# Patient Record
Sex: Female | Born: 1985 | Race: White | Hispanic: No | Marital: Married | Smoking: Never smoker
Health system: Southern US, Community
[De-identification: ages and names within clinical notes are randomized; demographics above are authoritative.]

## PROBLEM LIST (undated history)

## (undated) DIAGNOSIS — Z789 Other specified health status: Secondary | ICD-10-CM

## (undated) DIAGNOSIS — S62609A Fracture of unspecified phalanx of unspecified finger, initial encounter for closed fracture: Secondary | ICD-10-CM

## (undated) HISTORY — DX: Other specified health status: Z78.9

## (undated) HISTORY — PX: NO PAST SURGERIES: SHX2092

## (undated) HISTORY — PX: EYE SURGERY: SHX253

## (undated) HISTORY — PX: WISDOM TOOTH EXTRACTION: SHX21

---

## 2007-11-14 ENCOUNTER — Emergency Department (HOSPITAL_COMMUNITY): Admission: EM | Admit: 2007-11-14 | Discharge: 2007-11-15 | Payer: Self-pay | Admitting: Emergency Medicine

## 2011-04-06 LAB — ABO/RH: RH Type: POSITIVE

## 2011-04-06 LAB — ANTIBODY SCREEN: Antibody Screen: NEGATIVE

## 2011-04-06 LAB — CBC: Platelets: 298 10*3/uL (ref 150–399)

## 2011-04-06 LAB — STREP B DNA PROBE: GBS: NEGATIVE

## 2011-04-06 LAB — RUBELLA ANTIBODY, IGM: Rubella: IMMUNE

## 2011-07-10 NOTE — L&D Delivery Note (Signed)
Delivery Note At 5:44 PM a viable female was delivered via Vaginal, Spontaneous Delivery (Presentation: Right Occiput Posterior).  APGAR: 9, 9; weight 7 lb 4.8 oz (3311 g).   Placenta status: Intact, Spontaneous.  Cord: 3 vessels with the following complications: None.  Anesthesia: Epidural  Episiotomy: None Lacerations: 2nd degree;Perineal Suture Repair: 3.0 vicryl rapide and 2-0 vicryl to reinforce the sphincter Est. Blood Loss (mL): 400cc  Mom to postpartum.  Baby to nursery-stable.  Oliver Pila 10/15/2011, 6:37 PM

## 2011-09-17 LAB — STREP B DNA PROBE: GBS: NEGATIVE

## 2011-10-10 ENCOUNTER — Telehealth (HOSPITAL_COMMUNITY): Payer: Self-pay | Admitting: *Deleted

## 2011-10-10 ENCOUNTER — Encounter (HOSPITAL_COMMUNITY): Payer: Self-pay | Admitting: *Deleted

## 2011-10-10 NOTE — Telephone Encounter (Signed)
Preadmission screen  

## 2011-10-14 ENCOUNTER — Inpatient Hospital Stay (HOSPITAL_COMMUNITY)
Admission: AD | Admit: 2011-10-14 | Discharge: 2011-10-15 | Disposition: A | Discharge status: Home / Self Care | Payer: BC Managed Care – PPO | Source: Ambulatory Visit | Attending: Obstetrics and Gynecology | Admitting: Obstetrics and Gynecology

## 2011-10-14 ENCOUNTER — Encounter (HOSPITAL_COMMUNITY): Payer: Self-pay | Admitting: *Deleted

## 2011-10-14 DIAGNOSIS — O479 False labor, unspecified: Secondary | ICD-10-CM | POA: Insufficient documentation

## 2011-10-14 NOTE — MAU Note (Signed)
Pt states started having uc's regularly about 2 hours ago.Ruth Mccarthy Has had irregular uc's throughout the day and states she lost her mucous plug today also.

## 2011-10-15 ENCOUNTER — Encounter (HOSPITAL_COMMUNITY): Payer: Self-pay | Admitting: *Deleted

## 2011-10-15 ENCOUNTER — Inpatient Hospital Stay (HOSPITAL_COMMUNITY)
Admission: AD | Admit: 2011-10-15 | Discharge: 2011-10-17 | DRG: 373 | Disposition: A | Discharge status: Home / Self Care | Payer: BC Managed Care – PPO | Attending: Obstetrics and Gynecology | Admitting: Obstetrics and Gynecology

## 2011-10-15 ENCOUNTER — Inpatient Hospital Stay (HOSPITAL_COMMUNITY): Payer: BC Managed Care – PPO | Admitting: Anesthesiology

## 2011-10-15 ENCOUNTER — Encounter (HOSPITAL_COMMUNITY): Payer: Self-pay | Admitting: Anesthesiology

## 2011-10-15 LAB — CBC
MCH: 29.7 pg (ref 26.0–34.0)
MCV: 87.6 fL (ref 78.0–100.0)
Platelets: 194 10*3/uL (ref 150–400)
RDW: 13.2 % (ref 11.5–15.5)
WBC: 11 10*3/uL — ABNORMAL HIGH (ref 4.0–10.5)

## 2011-10-15 LAB — ABO/RH: ABO/RH(D): O POS

## 2011-10-15 LAB — GC/CHLAMYDIA PROBE AMP, GENITAL: Chlamydia: NEGATIVE

## 2011-10-15 MED ORDER — ZOLPIDEM TARTRATE 10 MG PO TABS
10.0000 mg | ORAL_TABLET | Freq: Once | ORAL | Status: AC
  Administered 2011-10-15: 10 mg via ORAL
  Filled 2011-10-15: qty 1

## 2011-10-15 MED ORDER — OXYCODONE-ACETAMINOPHEN 5-325 MG PO TABS
1.0000 | ORAL_TABLET | ORAL | Status: DC | PRN
  Administered 2011-10-15 – 2011-10-16 (×4): 1 via ORAL
  Filled 2011-10-15 (×4): qty 1

## 2011-10-15 MED ORDER — PHENYLEPHRINE 40 MCG/ML (10ML) SYRINGE FOR IV PUSH (FOR BLOOD PRESSURE SUPPORT): 80.0000 ug | PREFILLED_SYRINGE | INTRAVENOUS | Status: DC | PRN

## 2011-10-15 MED ORDER — CITRIC ACID-SODIUM CITRATE 334-500 MG/5ML PO SOLN: 30.0000 mL | ORAL | Status: DC | PRN

## 2011-10-15 MED ORDER — DIPHENHYDRAMINE HCL 50 MG/ML IJ SOLN: 12.5000 mg | INTRAMUSCULAR | Status: DC | PRN

## 2011-10-15 MED ORDER — OXYCODONE-ACETAMINOPHEN 5-325 MG PO TABS: 1.0000 | ORAL_TABLET | ORAL | Status: DC | PRN

## 2011-10-15 MED ORDER — OXYTOCIN BOLUS FROM INFUSION
500.0000 mL | Freq: Once | INTRAVENOUS | Status: DC
  Filled 2011-10-15: qty 500

## 2011-10-15 MED ORDER — IBUPROFEN 600 MG PO TABS
600.0000 mg | ORAL_TABLET | Freq: Four times a day (QID) | ORAL | Status: DC
  Administered 2011-10-15 – 2011-10-17 (×6): 600 mg via ORAL
  Filled 2011-10-15 (×7): qty 1

## 2011-10-15 MED ORDER — SENNOSIDES-DOCUSATE SODIUM 8.6-50 MG PO TABS
2.0000 | ORAL_TABLET | Freq: Every day | ORAL | Status: DC
  Administered 2011-10-15 – 2011-10-16 (×2): 2 via ORAL

## 2011-10-15 MED ORDER — LACTATED RINGERS IV SOLN: 500.0000 mL | INTRAVENOUS | Status: DC | PRN

## 2011-10-15 MED ORDER — OXYTOCIN 20 UNITS IN LACTATED RINGERS INFUSION - SIMPLE
1.0000 m[IU]/min | INTRAVENOUS | Status: DC
  Administered 2011-10-15: 2 m[IU]/min via INTRAVENOUS
  Filled 2011-10-15: qty 1000

## 2011-10-15 MED ORDER — TERBUTALINE SULFATE 1 MG/ML IJ SOLN: 0.2500 mg | Freq: Once | INTRAMUSCULAR | Status: DC | PRN

## 2011-10-15 MED ORDER — LIDOCAINE HCL (PF) 1 % IJ SOLN
INTRAMUSCULAR | Status: DC | PRN
  Administered 2011-10-15 (×3): 4 mL

## 2011-10-15 MED ORDER — ACETAMINOPHEN 325 MG PO TABS: 650.0000 mg | ORAL_TABLET | ORAL | Status: DC | PRN

## 2011-10-15 MED ORDER — FENTANYL 2.5 MCG/ML BUPIVACAINE 1/10 % EPIDURAL INFUSION (WH - ANES)
14.0000 mL/h | INTRAMUSCULAR | Status: DC
  Administered 2011-10-15 (×3): 14 mL/h via EPIDURAL
  Filled 2011-10-15 (×2): qty 60

## 2011-10-15 MED ORDER — ONDANSETRON HCL 4 MG PO TABS: 4.0000 mg | ORAL_TABLET | ORAL | Status: DC | PRN

## 2011-10-15 MED ORDER — LANOLIN HYDROUS EX OINT: TOPICAL_OINTMENT | CUTANEOUS | Status: DC | PRN

## 2011-10-15 MED ORDER — DIBUCAINE 1 % RE OINT: 1.0000 "application " | TOPICAL_OINTMENT | RECTAL | Status: DC | PRN

## 2011-10-15 MED ORDER — LACTATED RINGERS IV SOLN: 500.0000 mL | Freq: Once | INTRAVENOUS | Status: DC

## 2011-10-15 MED ORDER — TETANUS-DIPHTH-ACELL PERTUSSIS 5-2.5-18.5 LF-MCG/0.5 IM SUSP: 0.5000 mL | Freq: Once | INTRAMUSCULAR | Status: DC

## 2011-10-15 MED ORDER — LACTATED RINGERS IV SOLN
INTRAVENOUS | Status: DC
  Administered 2011-10-15: 13:00:00 via INTRAVENOUS

## 2011-10-15 MED ORDER — ONDANSETRON HCL 4 MG/2ML IJ SOLN: 4.0000 mg | Freq: Four times a day (QID) | INTRAMUSCULAR | Status: DC | PRN

## 2011-10-15 MED ORDER — PRENATAL MULTIVITAMIN CH
1.0000 | ORAL_TABLET | Freq: Every day | ORAL | Status: DC
  Administered 2011-10-16 – 2011-10-17 (×2): 1 via ORAL
  Filled 2011-10-15 (×2): qty 1

## 2011-10-15 MED ORDER — EPHEDRINE 5 MG/ML INJ
10.0000 mg | INTRAVENOUS | Status: DC | PRN
  Filled 2011-10-15: qty 4

## 2011-10-15 MED ORDER — ZOLPIDEM TARTRATE 5 MG PO TABS: 5.0000 mg | ORAL_TABLET | Freq: Every evening | ORAL | Status: DC | PRN

## 2011-10-15 MED ORDER — WITCH HAZEL-GLYCERIN EX PADS: 1.0000 "application " | MEDICATED_PAD | CUTANEOUS | Status: DC | PRN

## 2011-10-15 MED ORDER — LIDOCAINE HCL (PF) 1 % IJ SOLN
30.0000 mL | INTRAMUSCULAR | Status: DC | PRN
  Filled 2011-10-15: qty 30

## 2011-10-15 MED ORDER — EPHEDRINE 5 MG/ML INJ: 10.0000 mg | INTRAVENOUS | Status: DC | PRN

## 2011-10-15 MED ORDER — DIPHENHYDRAMINE HCL 25 MG PO CAPS: 25.0000 mg | ORAL_CAPSULE | Freq: Four times a day (QID) | ORAL | Status: DC | PRN

## 2011-10-15 MED ORDER — SIMETHICONE 80 MG PO CHEW
80.0000 mg | CHEWABLE_TABLET | ORAL | Status: DC | PRN
  Administered 2011-10-17 (×2): 80 mg via ORAL

## 2011-10-15 MED ORDER — OXYTOCIN 20 UNITS IN LACTATED RINGERS INFUSION - SIMPLE
125.0000 mL/h | Freq: Once | INTRAVENOUS | Status: AC
  Administered 2011-10-15: 125 mL/h via INTRAVENOUS

## 2011-10-15 MED ORDER — ONDANSETRON HCL 4 MG/2ML IJ SOLN: 4.0000 mg | INTRAMUSCULAR | Status: DC | PRN

## 2011-10-15 MED ORDER — IBUPROFEN 600 MG PO TABS: 600.0000 mg | ORAL_TABLET | Freq: Four times a day (QID) | ORAL | Status: DC | PRN

## 2011-10-15 MED ORDER — BENZOCAINE-MENTHOL 20-0.5 % EX AERO
1.0000 "application " | INHALATION_SPRAY | CUTANEOUS | Status: DC | PRN
  Administered 2011-10-16: 1 via TOPICAL

## 2011-10-15 MED ORDER — PHENYLEPHRINE 40 MCG/ML (10ML) SYRINGE FOR IV PUSH (FOR BLOOD PRESSURE SUPPORT)
80.0000 ug | PREFILLED_SYRINGE | INTRAVENOUS | Status: DC | PRN
  Filled 2011-10-15: qty 5

## 2011-10-15 NOTE — Anesthesia Preprocedure Evaluation (Signed)

## 2011-10-15 NOTE — Progress Notes (Signed)
   Subjective: Pt feeling mild pressure with contractions, pushing well  Objective: BP 110/79  Pulse 97  Temp(Src) 97.9 F (36.6 C) (Oral)  Resp 20  Ht 5\' 7"  (1.702 m)  Wt 86.637 kg (191 lb)  BMI 29.91 kg/m2  SpO2 99%   Total I/O In: -  Out: 300 [Urine:300]  FHT:  FHR: 125 bpm, variability: moderate,  accelerations:  Present,  decelerations:  Present variable decels with pushing UC:   regular, every 3 minutes SVE:   Pt pushing since 5pm, good progress +3 station with vertex visible with pushing Labs: Lab Results  Component Value Date   WBC 11.0* 10/15/2011   HGB 12.9 10/15/2011   HCT 38.0 10/15/2011   MCV 87.6 10/15/2011   PLT 194 10/15/2011    Assessment / Plan: Pushing with good progress, variable decels but recovers between contractions.  Ruth Mccarthy 10/15/2011, 5:24 PM

## 2011-10-15 NOTE — Progress Notes (Signed)
   Subjective: Pt feels well with epidural  Objective: BP 107/75  Pulse 85  Temp(Src) 97.5 F (36.4 C) (Oral)  Resp 20  Ht 5\' 7"  (1.702 m)  Wt 86.637 kg (191 lb)  BMI 29.91 kg/m2  SpO2 99%   Total I/O In: -  Out: 300 [Urine:300]  FHT:  FHR: 135 bpm, variability: moderate,  accelerations:  Present,  decelerations:  Absent UC:   regular, every 1-5 minutes SVE:   7/100/-1 to 0 AROM moderate meconium  Labs: Lab Results  Component Value Date   WBC 11.0* 10/15/2011   HGB 12.9 10/15/2011   HCT 38.0 10/15/2011   MCV 87.6 10/15/2011   PLT 194 10/15/2011    Assessment / Plan:  IUPC  placed as contractions irregular, will augment with pitocin as needed. D/w patient meconium stained fluid  Kobey Sides W 10/15/2011, 1:29 PM

## 2011-10-15 NOTE — H&P (Signed)
Ruth Mccarthy is a 26 y.o. female, G1 P0, EGA 40+ weeks with EDC 4-2 presenting for evaluation of ctx.  Pt here late last pm to r/o labor, VE 1 cm.  She presents again this am with reg ctx, VE initially still 1 cm dilated.  On my exam, she is 3-4 cm dilated.  Prenatal care essentially uncomplicated, see prenatal records for complete history.  Maternal Medical History:  Reason for admission: Reason for admission: contractions.  Contractions: Frequency: regular.   Perceived severity is strong.    Fetal activity: Perceived fetal activity is normal.    Prenatal complications: no prenatal complications   OB History    Grav Para Term Preterm Abortions TAB SAB Ect Mult Living   1              No past medical history on file. Past Surgical History  Procedure Date  . Wisdom tooth extraction   . Eye surgery    Family History: family history is negative for Anesthesia problems. Social History:  reports that she has never smoked. She does not have any smokeless tobacco history on file. She reports that she does not drink alcohol or use illicit drugs.  Review of Systems  Respiratory: Negative.   Cardiovascular: Negative.     Dilation: 3.5 Effacement (%): 90 Station: -1 Exam by:: Dr Jackelyn Knife Blood pressure 113/69, pulse 92, temperature 98.3 F (36.8 C), temperature source Oral, resp. rate 20, height 5\' 7"  (1.702 m), weight 86.637 kg (191 lb). Maternal Exam:  Uterine Assessment: Contraction strength is firm.  Contraction frequency is regular.   Abdomen: Patient reports no abdominal tenderness. Estimated fetal weight is 7 1/2 lbs.   Fetal presentation: vertex  Introitus: Normal vulva. Normal vagina.  Pelvis: adequate for delivery.   Cervix: Cervix evaluated by digital exam.     Fetal Exam Fetal Monitor Review: Mode: ultrasound.   Baseline rate: 140.  Variability: moderate (6-25 bpm).   Pattern: accelerations present and no decelerations.    Fetal State Assessment: Category I -  tracings are normal.     Physical Exam  Constitutional: She appears well-developed and well-nourished.  Cardiovascular: Normal rate, regular rhythm and normal heart sounds.   No murmur heard. Respiratory: Effort normal and breath sounds normal. No respiratory distress. She has no wheezes.  GI: Soft.       Gravid     Prenatal labs: ABO, Rh: O/Positive/-- (09/28 0000) Antibody: Negative (09/28 0000) Rubella: Immune (09/28 0000) RPR: Nonreactive (09/28 0000)  HBsAg: Negative (09/28 0000)  HIV: Non-reactive (09/28 0000)  GBS: Negative (09/28 0000)   Assessment/Plan: IUP at 40+ weeks in active labor.  Will admit, get epidural, monitor progress.     Gershom Brobeck D 10/15/2011, 8:37 AM

## 2011-10-15 NOTE — Anesthesia Procedure Notes (Signed)
Epidural Patient location during procedure: OB Start time: 10/15/2011 9:49 AM Reason for block: procedure for pain  Staffing Performed by: anesthesiologist   Preanesthetic Checklist Completed: patient identified, site marked, surgical consent, pre-op evaluation, timeout performed, IV checked, risks and benefits discussed and monitors and equipment checked  Epidural Patient position: sitting Prep: site prepped and draped and DuraPrep Patient monitoring: continuous pulse ox and blood pressure Approach: midline Injection technique: LOR air  Needle:  Needle type: Tuohy  Needle gauge: 17 G Needle length: 9 cm Catheter type: closed end flexible Catheter size: 19 Gauge Test dose: negative  Assessment Events: blood not aspirated, injection not painful, no injection resistance, negative IV test and no paresthesia  Additional Notes Discussed risk of headache, infection, bleeding, nerve injury and failed or incomplete block.  Patient voices understanding and wishes to proceed.

## 2011-10-16 LAB — CBC
Hemoglobin: 11.3 g/dL — ABNORMAL LOW (ref 12.0–15.0)
MCHC: 32.3 g/dL (ref 30.0–36.0)
WBC: 14.7 10*3/uL — ABNORMAL HIGH (ref 4.0–10.5)

## 2011-10-16 MED ORDER — BENZOCAINE-MENTHOL 20-0.5 % EX AERO
INHALATION_SPRAY | CUTANEOUS | Status: AC
  Administered 2011-10-16: 1 via TOPICAL
  Filled 2011-10-16: qty 56

## 2011-10-16 NOTE — Anesthesia Postprocedure Evaluation (Signed)
Anesthesia Post Note  Patient: Ruth Mccarthy  Procedure(s) Performed: * No procedures listed *  Anesthesia type: Epidural  Patient location: Mother/Baby  Post pain: Pain level controlled  Post assessment: Post-op Vital signs reviewed  Last Vitals:  Filed Vitals:   10/16/11 0145  BP: 102/67  Pulse: 89  Temp: 36.8 C  Resp: 18    Post vital signs: Reviewed  Level of consciousness:alert  Complications: No apparent anesthesia complications

## 2011-10-16 NOTE — Progress Notes (Signed)
Post Partum Day 1 Subjective: no complaints, up ad lib and tolerating PO  Objective: Blood pressure 102/67, pulse 89, temperature 98.2 F (36.8 C), temperature source Oral, resp. rate 18, height 5\' 7"  (1.702 m), weight 86.637 kg (191 lb), SpO2 99.00%, unknown if currently breastfeeding.  Physical Exam:  General: alert Lochia: appropriate Uterine Fundus: firm    Basename 10/16/11 0540 10/15/11 0850  HGB 11.3* 12.9  HCT 35.0* 38.0    Assessment/Plan: Plan for discharge tomorrow   LOS: 1 day   Ruth Mccarthy W 10/16/2011, 8:00 AM

## 2011-10-17 ENCOUNTER — Inpatient Hospital Stay (HOSPITAL_COMMUNITY): Admission: RE | Admit: 2011-10-17 | Payer: Self-pay | Source: Ambulatory Visit

## 2011-10-17 MED ORDER — IBUPROFEN 600 MG PO TABS: 600.0000 mg | ORAL_TABLET | Freq: Four times a day (QID) | ORAL | Status: AC

## 2011-10-17 MED ORDER — OXYCODONE-ACETAMINOPHEN 5-325 MG PO TABS: 1.0000 | ORAL_TABLET | ORAL | Status: AC | PRN

## 2011-10-17 NOTE — Discharge Summary (Signed)
Obstetric Discharge Summary Reason for Admission: onset of labor Prenatal Procedures: none Intrapartum Procedures: spontaneous vaginal delivery Postpartum Procedures: none Complications-Operative and Postpartum: second degree perineal laceration Hemoglobin  Date Value Range Status  10/16/2011 11.3* 12.0-15.0 (g/dL) Final     HCT  Date Value Range Status  10/16/2011 35.0* 36.0-46.0 (%) Final    Physical Exam:  General: alert Lochia: appropriate Uterine Fundus: firm   Discharge Diagnoses: Term Pregnancy-delivered  Discharge Information: Date: 10/17/2011 Activity: pelvic rest Diet: routine Medications: Ibuprofen and Percocet Condition: stable Instructions: refer to practice specific booklet Discharge to: home   Newborn Data: Live born female  Birth Weight: 7 lb 4.8 oz (3311 g) APGAR: 9, 9  Home with mother.  Ruth Mccarthy 10/17/2011, 8:10 AM

## 2011-10-17 NOTE — Progress Notes (Signed)
Post Partum Day 2 Subjective: no complaints, up ad lib and tolerating PO  Objective: Blood pressure 98/65, pulse 91, temperature 98.5 F (36.9 C), temperature source Oral, resp. rate 18, height 5\' 7"  (1.702 m), weight 86.637 kg (191 lb), SpO2 99.00%, unknown if currently breastfeeding.  Physical Exam:  General: alert Lochia: appropriate Uterine Fundus: firm    Basename 10/16/11 0540 10/15/11 0850  HGB 11.3* 12.9  HCT 35.0* 38.0    Assessment/Plan: Discharge home Motrin and percocet F/u 6 weeks   LOS: 2 days   Hakeen Shipes W 10/17/2011, 8:08 AM

## 2011-10-18 ENCOUNTER — Encounter (HOSPITAL_COMMUNITY): Payer: Self-pay | Admitting: *Deleted

## 2012-03-11 ENCOUNTER — Inpatient Hospital Stay (HOSPITAL_COMMUNITY): Admission: AD | Admit: 2012-03-11 | Payer: Self-pay | Source: Ambulatory Visit | Admitting: Obstetrics and Gynecology

## 2013-06-17 LAB — OB RESULTS CONSOLE ABO/RH: RH Type: POSITIVE

## 2013-06-24 LAB — OB RESULTS CONSOLE HEPATITIS B SURFACE ANTIGEN: Hepatitis B Surface Ag: NEGATIVE

## 2013-06-24 LAB — OB RESULTS CONSOLE RPR: RPR: NONREACTIVE

## 2013-06-24 LAB — OB RESULTS CONSOLE ANTIBODY SCREEN: ANTIBODY SCREEN: NEGATIVE

## 2013-06-24 LAB — OB RESULTS CONSOLE HIV ANTIBODY (ROUTINE TESTING): HIV: NONREACTIVE

## 2013-06-24 LAB — OB RESULTS CONSOLE RUBELLA ANTIBODY, IGM: RUBELLA: NON-IMMUNE/NOT IMMUNE

## 2013-06-24 LAB — OB RESULTS CONSOLE GC/CHLAMYDIA
Chlamydia: NEGATIVE
GC PROBE AMP, GENITAL: NEGATIVE

## 2013-07-09 NOTE — L&D Delivery Note (Signed)
Delivery Note At 3:01 AM a viable and healthy female was delivered via Vaginal, Spontaneous Delivery (Presentation: OA, LOT ).  APGAR: 9, 9; weight P.   Placenta status: Intact, Spontaneous.  Cord:  3V with the following complications: none .    Anesthesia: Epidural  Episiotomy: None Lacerations: 2nd degree Suture Repair: 3.0 vicryl rapide Est. Blood Loss (mL): 400cc  Mom to postpartum.  Baby to Couplet care / Skin to Skin.  Bovard-Stuckert, Carmelia Tiner 01/08/2014, 3:26 AM  Br/O+/Contra-condoms/

## 2013-12-28 LAB — OB RESULTS CONSOLE GBS: GBS: NEGATIVE

## 2014-01-07 ENCOUNTER — Encounter (HOSPITAL_COMMUNITY): Payer: Self-pay | Admitting: *Deleted

## 2014-01-07 ENCOUNTER — Inpatient Hospital Stay (HOSPITAL_COMMUNITY)
Admission: AD | Admit: 2014-01-07 | Discharge: 2014-01-09 | DRG: 775 | Disposition: A | Discharge status: Home / Self Care | Payer: BC Managed Care – PPO | Source: Ambulatory Visit | Attending: Obstetrics and Gynecology | Admitting: Obstetrics and Gynecology

## 2014-01-07 DIAGNOSIS — Z833 Family history of diabetes mellitus: Secondary | ICD-10-CM

## 2014-01-07 HISTORY — DX: Fracture of unspecified phalanx of unspecified finger, initial encounter for closed fracture: S62.609A

## 2014-01-07 LAB — POCT FERN TEST: POCT Fern Test: POSITIVE

## 2014-01-07 NOTE — MAU Note (Signed)
Water broke 2200 clear fluid. Having some irregular contractions.

## 2014-01-08 ENCOUNTER — Encounter (HOSPITAL_COMMUNITY): Payer: BC Managed Care – PPO | Admitting: Anesthesiology

## 2014-01-08 ENCOUNTER — Inpatient Hospital Stay (HOSPITAL_COMMUNITY): Payer: BC Managed Care – PPO | Admitting: Anesthesiology

## 2014-01-08 ENCOUNTER — Encounter (HOSPITAL_COMMUNITY): Payer: Self-pay | Admitting: General Practice

## 2014-01-08 LAB — CBC
HCT: 33.9 % — ABNORMAL LOW (ref 36.0–46.0)
HEMATOCRIT: 35.1 % — AB (ref 36.0–46.0)
Hemoglobin: 11.4 g/dL — ABNORMAL LOW (ref 12.0–15.0)
Hemoglobin: 11.8 g/dL — ABNORMAL LOW (ref 12.0–15.0)
MCH: 29.4 pg (ref 26.0–34.0)
MCH: 29.5 pg (ref 26.0–34.0)
MCHC: 33.6 g/dL (ref 30.0–36.0)
MCHC: 33.6 g/dL (ref 30.0–36.0)
MCV: 87.5 fL (ref 78.0–100.0)
MCV: 87.6 fL (ref 78.0–100.0)
Platelets: 134 10*3/uL — ABNORMAL LOW (ref 150–400)
Platelets: 185 10*3/uL (ref 150–400)
RBC: 3.87 MIL/uL (ref 3.87–5.11)
RBC: 4.01 MIL/uL (ref 3.87–5.11)
RDW: 13.1 % (ref 11.5–15.5)
RDW: 13.2 % (ref 11.5–15.5)
WBC: 11.7 10*3/uL — ABNORMAL HIGH (ref 4.0–10.5)
WBC: 7.1 10*3/uL (ref 4.0–10.5)

## 2014-01-08 LAB — RPR

## 2014-01-08 MED ORDER — WITCH HAZEL-GLYCERIN EX PADS: 1.0000 "application " | MEDICATED_PAD | CUTANEOUS | Status: DC | PRN

## 2014-01-08 MED ORDER — OXYTOCIN 40 UNITS IN LACTATED RINGERS INFUSION - SIMPLE MED: 62.5000 mL/h | INTRAVENOUS | Status: DC

## 2014-01-08 MED ORDER — LACTATED RINGERS IV SOLN
500.0000 mL | Freq: Once | INTRAVENOUS | Status: AC
  Administered 2014-01-08: 02:00:00 via INTRAVENOUS

## 2014-01-08 MED ORDER — DIPHENHYDRAMINE HCL 50 MG/ML IJ SOLN: 12.5000 mg | INTRAMUSCULAR | Status: DC | PRN

## 2014-01-08 MED ORDER — PRENATAL MULTIVITAMIN CH
1.0000 | ORAL_TABLET | Freq: Every day | ORAL | Status: DC
  Administered 2014-01-08: 1 via ORAL
  Filled 2014-01-08: qty 1

## 2014-01-08 MED ORDER — DIPHENHYDRAMINE HCL 25 MG PO CAPS: 25.0000 mg | ORAL_CAPSULE | Freq: Four times a day (QID) | ORAL | Status: DC | PRN

## 2014-01-08 MED ORDER — LACTATED RINGERS IV SOLN: 500.0000 mL | INTRAVENOUS | Status: DC | PRN

## 2014-01-08 MED ORDER — ONDANSETRON HCL 4 MG/2ML IJ SOLN: 4.0000 mg | Freq: Four times a day (QID) | INTRAMUSCULAR | Status: DC | PRN

## 2014-01-08 MED ORDER — IBUPROFEN 600 MG PO TABS
600.0000 mg | ORAL_TABLET | Freq: Four times a day (QID) | ORAL | Status: DC
  Administered 2014-01-08 – 2014-01-09 (×5): 600 mg via ORAL
  Filled 2014-01-08 (×5): qty 1

## 2014-01-08 MED ORDER — OXYCODONE-ACETAMINOPHEN 5-325 MG PO TABS: 1.0000 | ORAL_TABLET | ORAL | Status: DC | PRN

## 2014-01-08 MED ORDER — OXYTOCIN BOLUS FROM INFUSION
500.0000 mL | INTRAVENOUS | Status: DC
  Administered 2014-01-08: 500 mL via INTRAVENOUS

## 2014-01-08 MED ORDER — EPHEDRINE 5 MG/ML INJ
10.0000 mg | INTRAVENOUS | Status: DC | PRN
  Filled 2014-01-08: qty 2

## 2014-01-08 MED ORDER — FLEET ENEMA 7-19 GM/118ML RE ENEM: 1.0000 | ENEMA | RECTAL | Status: DC | PRN

## 2014-01-08 MED ORDER — LACTATED RINGERS IV SOLN: INTRAVENOUS | Status: DC

## 2014-01-08 MED ORDER — TERBUTALINE SULFATE 1 MG/ML IJ SOLN: 0.2500 mg | Freq: Once | INTRAMUSCULAR | Status: DC | PRN

## 2014-01-08 MED ORDER — LIDOCAINE HCL (PF) 1 % IJ SOLN
INTRAMUSCULAR | Status: DC | PRN
  Administered 2014-01-08: 10 mL

## 2014-01-08 MED ORDER — DIBUCAINE 1 % RE OINT: 1.0000 "application " | TOPICAL_OINTMENT | RECTAL | Status: DC | PRN

## 2014-01-08 MED ORDER — IBUPROFEN 600 MG PO TABS
600.0000 mg | ORAL_TABLET | Freq: Four times a day (QID) | ORAL | Status: DC | PRN
  Administered 2014-01-08: 600 mg via ORAL
  Filled 2014-01-08: qty 1

## 2014-01-08 MED ORDER — FENTANYL 2.5 MCG/ML BUPIVACAINE 1/10 % EPIDURAL INFUSION (WH - ANES)
14.0000 mL/h | INTRAMUSCULAR | Status: DC | PRN
  Filled 2014-01-08: qty 125

## 2014-01-08 MED ORDER — BUTORPHANOL TARTRATE 1 MG/ML IJ SOLN: 1.0000 mg | INTRAMUSCULAR | Status: DC | PRN

## 2014-01-08 MED ORDER — ONDANSETRON HCL 4 MG PO TABS: 4.0000 mg | ORAL_TABLET | ORAL | Status: DC | PRN

## 2014-01-08 MED ORDER — SIMETHICONE 80 MG PO CHEW: 80.0000 mg | CHEWABLE_TABLET | ORAL | Status: DC | PRN

## 2014-01-08 MED ORDER — CITRIC ACID-SODIUM CITRATE 334-500 MG/5ML PO SOLN: 30.0000 mL | ORAL | Status: DC | PRN

## 2014-01-08 MED ORDER — OXYTOCIN 40 UNITS IN LACTATED RINGERS INFUSION - SIMPLE MED
1.0000 m[IU]/min | INTRAVENOUS | Status: DC
  Administered 2014-01-08: 2 m[IU]/min via INTRAVENOUS
  Filled 2014-01-08: qty 1000

## 2014-01-08 MED ORDER — TETANUS-DIPHTH-ACELL PERTUSSIS 5-2.5-18.5 LF-MCG/0.5 IM SUSP: 0.5000 mL | Freq: Once | INTRAMUSCULAR | Status: DC

## 2014-01-08 MED ORDER — ACETAMINOPHEN 325 MG PO TABS: 650.0000 mg | ORAL_TABLET | ORAL | Status: DC | PRN

## 2014-01-08 MED ORDER — FENTANYL 2.5 MCG/ML BUPIVACAINE 1/10 % EPIDURAL INFUSION (WH - ANES)
14.0000 mL/h | INTRAMUSCULAR | Status: DC | PRN
  Administered 2014-01-08: 14 mL/h via EPIDURAL

## 2014-01-08 MED ORDER — ONDANSETRON HCL 4 MG/2ML IJ SOLN: 4.0000 mg | INTRAMUSCULAR | Status: DC | PRN

## 2014-01-08 MED ORDER — BENZOCAINE-MENTHOL 20-0.5 % EX AERO: 1.0000 "application " | INHALATION_SPRAY | CUTANEOUS | Status: DC | PRN

## 2014-01-08 MED ORDER — SENNOSIDES-DOCUSATE SODIUM 8.6-50 MG PO TABS
2.0000 | ORAL_TABLET | ORAL | Status: DC
  Administered 2014-01-09: 2 via ORAL
  Filled 2014-01-08: qty 2

## 2014-01-08 MED ORDER — PHENYLEPHRINE 40 MCG/ML (10ML) SYRINGE FOR IV PUSH (FOR BLOOD PRESSURE SUPPORT)
80.0000 ug | PREFILLED_SYRINGE | INTRAVENOUS | Status: DC | PRN
Start: 2014-01-08 — End: 2014-01-09
  Filled 2014-01-08: qty 2

## 2014-01-08 MED ORDER — LACTATED RINGERS IV SOLN
INTRAVENOUS | Status: DC
  Administered 2014-01-08: via INTRAVENOUS

## 2014-01-08 MED ORDER — PHENYLEPHRINE 40 MCG/ML (10ML) SYRINGE FOR IV PUSH (FOR BLOOD PRESSURE SUPPORT)
80.0000 ug | PREFILLED_SYRINGE | INTRAVENOUS | Status: DC | PRN
  Filled 2014-01-08: qty 10
  Filled 2014-01-08: qty 2

## 2014-01-08 MED ORDER — ZOLPIDEM TARTRATE 5 MG PO TABS: 5.0000 mg | ORAL_TABLET | Freq: Every evening | ORAL | Status: DC | PRN

## 2014-01-08 MED ORDER — LIDOCAINE HCL (PF) 1 % IJ SOLN
30.0000 mL | INTRAMUSCULAR | Status: DC | PRN
  Administered 2014-01-08: 30 mL via SUBCUTANEOUS
  Filled 2014-01-08: qty 30

## 2014-01-08 MED ORDER — LANOLIN HYDROUS EX OINT: TOPICAL_OINTMENT | CUTANEOUS | Status: DC | PRN

## 2014-01-08 NOTE — H&P (Signed)
Ruth Mccarthy is a 28 y.o. female G2P1001 at 38+ with ROM.  +FM,  LOF for clear fluid, no VB, occ ctx.  Relatively uncomplicated PNC.     Maternal Medical History:  Reason for admission: Rupture of membranes.   Fetal activity: Perceived fetal activity is normal.    Prenatal Complications - Diabetes: none.    OB History   Grav Para Term Preterm Abortions TAB SAB Ect Mult Living   2 1 1  0 0 0 0 0 0 1    G1 SVD 4/13 7#4 female G2 present No STD No abn pap  Past Medical History  Diagnosis Date  . No pertinent past medical history   . Broken finger   . SVD (spontaneous vaginal delivery) 01/08/2014   Past Surgical History  Procedure Laterality Date  . No past surgeries    . Wisdom tooth extraction    . Eye surgery     Family History: family history includes Alcohol abuse in her maternal grandfather, maternal uncle, and mother; Cancer in her mother; Diabetes in her maternal grandfather; Hyperlipidemia in her maternal grandmother; Hypertension in her maternal grandfather and mother; Vision loss in her maternal grandmother. There is no history of Anesthesia problems. Social History:  reports that she has never smoked. She has never used smokeless tobacco. She reports that she does not drink alcohol or use illicit drugs.married, Runner, broadcasting/film/videoteacher Meds none All NKDA   Prenatal Transfer Tool  Maternal Diabetes: No Genetic Screening: Declined Maternal Ultrasounds/Referrals: Normal Fetal Ultrasounds or other Referrals:  None Maternal Substance Abuse:  No Significant Maternal Medications:  None Significant Maternal Lab Results:  Lab values include: Group B Strep negative Other Comments:  None  Review of Systems  Constitutional: Negative.   HENT: Negative.   Eyes: Negative.   Respiratory: Negative.   Cardiovascular: Negative.   Gastrointestinal: Negative.   Genitourinary: Negative.   Musculoskeletal: Negative.   Skin: Negative.   Neurological: Negative.   Psychiatric/Behavioral:  Negative.     Dilation: 10 Effacement (%): 100 Station: +1 Exam by:: Ruth Mccarthy Blood pressure 110/63, pulse 70, temperature 98 F (36.7 C), temperature source Oral, resp. rate 20, height 5\' 7"  (1.702 m), weight 79.379 kg (175 lb), SpO2 100.00%, unknown if currently breastfeeding. Maternal Exam:  Abdomen: Fundal height is appropriate for gestation.   Estimated fetal weight is 7#.   Fetal presentation: vertex  Introitus: Normal vulva. Normal vagina.  Pelvis: adequate for delivery.      Physical Exam  Constitutional: She is oriented to person, place, and time. She appears well-developed and well-nourished.  HENT:  Head: Normocephalic and atraumatic.  Cardiovascular: Normal rate and regular rhythm.   Respiratory: Effort normal and breath sounds normal. No respiratory distress. She has no wheezes.  GI: Soft. Bowel sounds are normal. She exhibits no distension. There is no tenderness.  Musculoskeletal: Normal range of motion.  Neurological: She is alert and oriented to person, place, and time.  Skin: Skin is warm and dry.  Psychiatric: She has a normal mood and affect. Her behavior is normal.    Prenatal labs: ABO, Rh: O/Positive/-- (12/10 0000) Antibody: Negative (12/17 0000) Rubella: Nonimmune (12/17 0000) RPR: Nonreactive (12/17 0000)  HBsAg: Negative (12/17 0000)  HIV: Non-reactive (12/17 0000)  GBS: Negative (06/22 0000)   Hgb 14.2/ Ur Cx neg/GC neg/ GC neg/Pap WNL/declined screening/glucola 1555 - nl 3hr GTT/Plt 311K  US nl anat, post plac  Tdap 10/29/13 Assessment/Plan: 28yo G2P1001 at 38+ with ROM GBBS neg Plan for SVD Pitocin prn  Bovard-Stuckert, Ruth Mccarthy 01/08/2014, 3:38 AM

## 2014-01-08 NOTE — Lactation Note (Signed)
This note was copied from the chart of Ruth Sibyl ParrKasie Balentine. Lactation Consultation Note Initial visit at 13 hours of age.  Mom reports a few feedings with a little nipple soreness.  Mom reports trying cradle hold and is open to trying football hold.  Mom just changed a wet and dirty diaper, baby is in quiet awake state.  Assisted with pillow positioning and placing baby in football hold.  Mom demonstrated hand expression with colostrum visible.  Mom has good technique, but baby is too sleepy to open mouth wide for latch.  Baby enjoys STS after several minutes of latch attempt. Encouraged mom to wait for wide open mouth and call for assist as needed.  [facility] given and discussed.  Encouraged to feed with early cues on demand.  Early newborn behavior discussed.    Patient Name: Ruth Mccarthy [id number]'s Date: 01/08/2014 Reason for consult: Initial assessment   Maternal Data Has patient been taught Hand Expression?: Yes Does the patient have breastfeeding experience prior to this delivery?: Yes  Feeding Feeding Type: Breast Fed  LATCH Score/Interventions Latch: Too sleepy or reluctant, no latch achieved, no sucking elicited. Intervention(s): Teach feeding cues;Waking techniques Intervention(s): Assist with latch;Adjust position;Breast massage;Breast compression  Audible Swallowing: None Intervention(s): Skin to skin;Hand expression  Type of Nipple: Everted at rest and after stimulation  Comfort (Breast/Nipple): Soft / non-tender     Hold (Positioning): Assistance needed to correctly position infant at breast and maintain latch. Intervention(s): Breastfeeding basics reviewed;Support Pillows;Position options;Skin to skin  LATCH Score: 5  Lactation Tools Discussed/Used     Consult Status Consult Status: Follow-up Date: 01/09/14 Follow-up type: In-patient    Ruth Mccarthy, Arvella MerlesJana Lynn 01/08/2014, 4:53 PM

## 2014-01-08 NOTE — Anesthesia Postprocedure Evaluation (Signed)
  Anesthesia Post-op Note  Anesthesia Post Note  Patient: Ruth Mccarthy  Procedure(s) Performed: * No procedures listed *  Anesthesia type: Epidural  Patient location: Mother/Baby  Post pain: Pain level controlled  Post assessment: Post-op Vital signs reviewed  Last Vitals:  Filed Vitals:   01/08/14 0656  BP: 101/60  Pulse: 60  Temp: 36.7 C  Resp: 20    Post vital signs: Reviewed  Level of consciousness:alert  Complications: No apparent anesthesia complications

## 2014-01-08 NOTE — Anesthesia Procedure Notes (Signed)

## 2014-01-08 NOTE — Progress Notes (Signed)
PPD #0 No problems Afeb, VSS Fundus firm, NT at U-1 Continue routine postpartum care 

## 2014-01-08 NOTE — H&P (Signed)
error 

## 2014-01-08 NOTE — Anesthesia Preprocedure Evaluation (Addendum)

## 2014-01-09 MED ORDER — IBUPROFEN 600 MG PO TABS: 600.0000 mg | ORAL_TABLET | Freq: Four times a day (QID) | ORAL | Status: DC

## 2014-01-09 NOTE — Progress Notes (Signed)
PPD #1 No problems, wants to go home Afeb, VSS Fundus firm, NT at U-1 Continue routine postpartum care, d/c home 

## 2014-01-09 NOTE — Discharge Instructions (Signed)
As per discharge pamphlet °

## 2014-01-09 NOTE — Discharge Summary (Signed)
Obstetric Discharge Summary Reason for Admission: onset of labor and rupture of membranes Prenatal Procedures: none Intrapartum Procedures: spontaneous vaginal delivery Postpartum Procedures: none Complications-Operative and Postpartum: 2nd degree perineal laceration Hemoglobin  Date Value Ref Range Status  01/08/2014 11.4* 12.0 - 15.0 g/dL Final     HCT  Date Value Ref Range Status  01/08/2014 33.9* 36.0 - 46.0 % Final    Physical Exam:  General: alert Lochia: appropriate Uterine Fundus: firm  Discharge Diagnoses: Term Pregnancy-delivered  Discharge Information: Date: 01/09/2014 Activity: pelvic rest Diet: routine Medications: Ibuprofen Condition: stable Instructions: refer to practice specific booklet Discharge to: home Follow-up Information   Follow up with Kandance Yano D, MD. Schedule an appointment as soon as possible for a visit in 6 weeks.   Specialty:  Obstetrics and Gynecology   Contact information:   [address], [address] [place] [address] [phone]       Newborn Data: Live born female  Birth Weight: 6 lb 15.8 oz (3170 g) APGAR: 9, 9  Home with mother.  Henrietta Cieslewicz D 01/09/2014, 9:31 AM

## 2014-05-10 ENCOUNTER — Encounter (HOSPITAL_COMMUNITY): Payer: Self-pay | Admitting: General Practice

## 2017-03-01 LAB — HM PAP SMEAR: HM Pap smear: NORMAL

## 2018-07-09 NOTE — L&D Delivery Note (Signed)
Delivery Note Pt complete after epidural and SROM with pelvic pressure reported. She pushed for about 32mins and at 1:36 AM a viable female was delivered via Vaginal, Spontaneous (Presentation:MOP ;  ).  APGAR: 8, 9; weight pending .   Placenta status:delivered intact, schultz , .  Cord: 3vc  with the following complications:double true knot .  Cord pH: n/a  Anesthesia:  Epidural and local lidocaine Episiotomy: None Lacerations: 2nd degree Suture Repair: 2.0 vicryl Est. Blood Loss (mL): 95  Mom to postpartum.  Baby to Couplet care / Skin to Skin.  Ruth Mccarthy 12/23/2018, 2:12 AM

## 2018-12-15 ENCOUNTER — Telehealth (HOSPITAL_COMMUNITY): Payer: Self-pay | Admitting: *Deleted

## 2018-12-15 ENCOUNTER — Encounter (HOSPITAL_COMMUNITY): Payer: Self-pay | Admitting: *Deleted

## 2018-12-15 NOTE — Telephone Encounter (Signed)
Preadmission screen  

## 2018-12-22 ENCOUNTER — Other Ambulatory Visit: Payer: Self-pay

## 2018-12-22 ENCOUNTER — Encounter (HOSPITAL_COMMUNITY): Payer: Self-pay

## 2018-12-22 ENCOUNTER — Inpatient Hospital Stay (EMERGENCY_DEPARTMENT_HOSPITAL)
Admission: AD | Admit: 2018-12-22 | Discharge: 2018-12-22 | Disposition: A | Discharge status: Home / Self Care | Source: Home / Self Care | Attending: Obstetrics and Gynecology | Admitting: Obstetrics and Gynecology

## 2018-12-22 ENCOUNTER — Other Ambulatory Visit (HOSPITAL_COMMUNITY)
Admission: RE | Admit: 2018-12-22 | Discharge: 2018-12-22 | Disposition: A | Discharge status: Home / Self Care | Source: Ambulatory Visit | Attending: Obstetrics and Gynecology | Admitting: Obstetrics and Gynecology

## 2018-12-22 DIAGNOSIS — N898 Other specified noninflammatory disorders of vagina: Secondary | ICD-10-CM

## 2018-12-22 DIAGNOSIS — O26893 Other specified pregnancy related conditions, third trimester: Secondary | ICD-10-CM

## 2018-12-22 DIAGNOSIS — Z1159 Encounter for screening for other viral diseases: Secondary | ICD-10-CM | POA: Insufficient documentation

## 2018-12-22 DIAGNOSIS — Z3A40 40 weeks gestation of pregnancy: Secondary | ICD-10-CM | POA: Diagnosis not present

## 2018-12-22 DIAGNOSIS — Z3689 Encounter for other specified antenatal screening: Secondary | ICD-10-CM | POA: Diagnosis not present

## 2018-12-22 LAB — POCT FERN TEST: POCT Fern Test: NEGATIVE

## 2018-12-22 NOTE — MAU Note (Signed)
Pt presents to MAU with c/o PROM. Felt gush of clear/blood tinged fluid this morning around 0530, has not had leaking since. She has had intermittent contractions since last night. She has noticed bloody show since gush of fluid. +FM

## 2018-12-22 NOTE — Discharge Instructions (Signed)
Braxton Hicks Contractions Contractions of the uterus can occur throughout pregnancy, but they are not always a sign that you are in labor. You may have practice contractions called Braxton Hicks contractions. These false labor contractions are sometimes confused with true labor. What are Braxton Hicks contractions? Braxton Hicks contractions are tightening movements that occur in the muscles of the uterus before labor. Unlike true labor contractions, these contractions do not result in opening (dilation) and thinning of the cervix. Toward the end of pregnancy (32-34 weeks), Braxton Hicks contractions can happen more often and may become stronger. These contractions are sometimes difficult to tell apart from true labor because they can be very uncomfortable. You should not feel embarrassed if you go to the hospital with false labor. Sometimes, the only way to tell if you are in true labor is for your health care provider to look for changes in the cervix. The health care provider will do a physical exam and may monitor your contractions. If you are not in true labor, the exam should show that your cervix is not dilating and your water has not broken. If there are no other health problems associated with your pregnancy, it is completely safe for you to be sent home with false labor. You may continue to have Braxton Hicks contractions until you go into true labor. How to tell the difference between true labor and false labor True labor  Contractions last 30-70 seconds.  Contractions become very regular.  Discomfort is usually felt in the top of the uterus, and it spreads to the lower abdomen and low back.  Contractions do not go away with walking.  Contractions usually become more intense and increase in frequency.  The cervix dilates and gets thinner. False labor  Contractions are usually shorter and not as strong as true labor contractions.  Contractions are usually irregular.  Contractions  are often felt in the front of the lower abdomen and in the groin.  Contractions may go away when you walk around or change positions while lying down.  Contractions get weaker and are shorter-lasting as time goes on.  The cervix usually does not dilate or become thin. Follow these instructions at home:   Take over-the-counter and prescription medicines only as told by your health care provider.  Keep up with your usual exercises and follow other instructions from your health care provider.  Eat and drink lightly if you think you are going into labor.  If Braxton Hicks contractions are making you uncomfortable: ? Change your position from lying down or resting to walking, or change from walking to resting. ? Sit and rest in a tub of warm water. ? Drink enough fluid to keep your urine pale yellow. Dehydration may cause these contractions. ? Do slow and deep breathing several times an hour.  Keep all follow-up prenatal visits as told by your health care provider. This is important. Contact a health care provider if:  You have a fever.  You have continuous pain in your abdomen. Get help right away if:  Your contractions become stronger, more regular, and closer together.  You have fluid leaking or gushing from your vagina.  You pass blood-tinged mucus (bloody show).  You have bleeding from your vagina.  You have low back pain that you never had before.  You feel your baby's head pushing down and causing pelvic pressure.  Your baby is not moving inside you as much as it used to. Summary  Contractions that occur before labor are   called Braxton Hicks contractions, false labor, or practice contractions.  Braxton Hicks contractions are usually shorter, weaker, farther apart, and less regular than true labor contractions. True labor contractions usually become progressively stronger and regular, and they become more frequent.  Manage discomfort from Braxton Hicks contractions  by changing position, resting in a warm bath, drinking plenty of water, or practicing deep breathing. This information is not intended to replace advice given to you by your health care provider. Make sure you discuss any questions you have with your health care provider. Document Released: 11/08/2016 Document Revised: 04/09/2017 Document Reviewed: 11/08/2016 Elsevier Interactive Patient Education  2019 [employer]  

## 2018-12-22 NOTE — MAU Provider Note (Addendum)
History   [id number]   Chief Complaint  Patient presents with  . Rupture of Membranes    HPI Ruth Mccarthy is a 33 y.o. female  [id number] @40 .3 wks here for COVID testing (pre induction) with report of small gush of fluid x2. Felt fluid come out on liner, looked brown. Leaking of fluid has not continued. Pt reports rare contractions. She denies vaginal bleeding. Last intercourse was not recent. She reports good fetal movement. Had membranes stripped 3 days ago, small amt of brown spotting since. All other systems negative.    No LMP recorded. Patient is pregnant.  OB History  Gravida Para Term Preterm AB Living  4 2 2  0 1 2  SAB TAB Ectopic Multiple Live Births  1 0 0 0 2    # Outcome Date GA Lbr Len/2nd Weight Sex Delivery Anes PTL Lv  4 Current           3 Term 01/08/14 [redacted]w[redacted]d 04:36 / 00:15 3170 g M Vag-Spont EPI  LIV  2 Term 10/15/11 [redacted]w[redacted]d 09:28 / 02:16 3311 g F Vag-Spont EPI  LIV     Birth Comments: Moulding at forehead due to op position  1 SAB             Past Medical History:  Diagnosis Date  . Broken finger   . No pertinent past medical history   . SVD (spontaneous vaginal delivery) 01/08/2014    Family History  Problem Relation Age of Onset  . Alcohol abuse Mother   . Cancer Mother        cervix  . Hypertension Mother   . Alcohol abuse Maternal Uncle   . Hyperlipidemia Maternal Grandmother   . Vision loss Maternal Grandmother   . Diabetes Maternal Grandfather   . Hypertension Maternal Grandfather   . Stroke Maternal Grandfather   . Anesthesia problems Neg Hx     Social History   Socioeconomic History  . Marital status: Married    Spouse name: Not on file  . Number of children: Not on file  . Years of education: Not on file  . Highest education level: Not on file  Occupational History  . Not on file  Social Needs  . Financial resource strain: Not hard at all  . Food insecurity    Worry: Never true    Inability: Never true  . Transportation needs     Medical: No    Non-medical: Not on file  Tobacco Use  . Smoking status: Never Smoker  . Smokeless tobacco: Never Used  Substance and Sexual Activity  . Alcohol use: No  . Drug use: No  . Sexual activity: Yes  Lifestyle  . Physical activity    Days per week: Not on file    Minutes per session: Not on file  . Stress: Only a little  Relationships  . Social Herbalist on phone: Not on file    Gets together: Not on file    Attends religious service: Not on file    Active member of club or organization: Not on file    Attends meetings of clubs or organizations: Not on file    Relationship status: Not on file  Other Topics Concern  . Not on file  Social History Narrative   ** Merged History Encounter **        Allergies  Allergen Reactions  . Kiwi Extract Itching    No current facility-administered medications on file prior to encounter.  Current Outpatient Medications on File Prior to Encounter  Medication Sig Dispense Refill  . acetaminophen (TYLENOL) 325 MG tablet Take 325 mg by mouth every 6 (six) hours as needed for mild pain or headache.     . ibuprofen (ADVIL,MOTRIN) 600 MG tablet Take 1 tablet (600 mg total) by mouth every 6 (six) hours. 30 tablet 0  . Prenatal Vit-Fe Fumarate-FA (PRENATAL MULTIVITAMIN) TABS Take 1 tablet by mouth daily.     Review of Systems  Gastrointestinal: Negative for abdominal pain.  Genitourinary: Positive for vaginal discharge.   Physical Exam   Vitals:   12/22/18 0831 12/22/18 0840  BP: 129/79 123/86  Pulse: 90 94  Resp: 18   Temp: 98.3 F (36.8 C)   SpO2: 100% 99%    Physical Exam  Constitutional: She is oriented to person, place, and time. She appears well-developed and well-nourished. No distress.  HENT:  Head: Normocephalic and atraumatic.  Neck: Normal range of motion.  Cardiovascular: Normal rate.  Respiratory: Effort normal. No respiratory distress.  Genitourinary:    Genitourinary Comments: SSE: no  pool, fern negative   Musculoskeletal: Normal range of motion.  Neurological: She is alert and oriented to person, place, and time.  Skin: Skin is warm.  Psychiatric: She has a normal mood and affect.  EFM: 150 bpm, mod variability, + accels, no decels Toco: none  Results for orders placed or performed during the hospital encounter of 12/22/18 (from the past 24 hour(s))  POCT fern test     Status: None   Collection Time: 12/22/18  9:13 AM  Result Value Ref Range   POCT Fern Test Negative = intact amniotic membranes    MAU Course  Procedures  MDM Labs ordered and reviewed. No evidence of SROM. Stable for discharge home.  Assessment and Plan   1. [redacted] weeks gestation of pregnancy   2. NST (non-stress test) reactive   3. Vaginal discharge during pregnancy in third trimester    Discharge home Follow up in 2 days as scheduled for IOL Labor precautions  Allergies as of 12/22/2018      Reactions   Kiwi Extract Itching      Medication List    STOP taking these medications   ibuprofen 600 MG tablet Commonly known as: ADVIL     TAKE these medications   acetaminophen 325 MG tablet Commonly known as: TYLENOL Take 325 mg by mouth every 6 (six) hours as needed for mild pain or headache.   prenatal multivitamin Tabs tablet Take 1 tablet by mouth daily.      Donette LarryBhambri, Karlee Staff, CNM 12/22/2018 9:32 AM

## 2018-12-22 NOTE — MAU Note (Signed)
Asymptomatic, swabbed without problem

## 2018-12-23 ENCOUNTER — Inpatient Hospital Stay (HOSPITAL_COMMUNITY): Admitting: Anesthesiology

## 2018-12-23 ENCOUNTER — Encounter (HOSPITAL_COMMUNITY): Payer: Self-pay

## 2018-12-23 ENCOUNTER — Other Ambulatory Visit: Payer: Self-pay

## 2018-12-23 ENCOUNTER — Inpatient Hospital Stay (HOSPITAL_COMMUNITY)
Admission: AD | Admit: 2018-12-23 | Discharge: 2018-12-24 | DRG: 807 | Disposition: A | Discharge status: Home / Self Care | Attending: Obstetrics and Gynecology | Admitting: Obstetrics and Gynecology

## 2018-12-23 DIAGNOSIS — Z3A40 40 weeks gestation of pregnancy: Secondary | ICD-10-CM | POA: Diagnosis not present

## 2018-12-23 DIAGNOSIS — O26893 Other specified pregnancy related conditions, third trimester: Secondary | ICD-10-CM | POA: Diagnosis present

## 2018-12-23 DIAGNOSIS — Z1159 Encounter for screening for other viral diseases: Secondary | ICD-10-CM | POA: Diagnosis not present

## 2018-12-23 LAB — CBC
HCT: 38.4 % (ref 36.0–46.0)
HCT: 38.4 % (ref 36.0–46.0)
Hemoglobin: 12.5 g/dL (ref 12.0–15.0)
Hemoglobin: 12.9 g/dL (ref 12.0–15.0)
MCH: 29.3 pg (ref 26.0–34.0)
MCH: 29.4 pg (ref 26.0–34.0)
MCHC: 32.6 g/dL (ref 30.0–36.0)
MCHC: 33.6 g/dL (ref 30.0–36.0)
MCV: 87.3 fL (ref 80.0–100.0)
MCV: 90.4 fL (ref 80.0–100.0)
Platelets: 232 10*3/uL (ref 150–400)
Platelets: 257 10*3/uL (ref 150–400)
RBC: 4.25 MIL/uL (ref 3.87–5.11)
RBC: 4.4 MIL/uL (ref 3.87–5.11)
RDW: 13.7 % (ref 11.5–15.5)
RDW: 13.8 % (ref 11.5–15.5)
WBC: 10.7 10*3/uL — ABNORMAL HIGH (ref 4.0–10.5)
WBC: 13.2 10*3/uL — ABNORMAL HIGH (ref 4.0–10.5)
nRBC: 0 % (ref 0.0–0.2)
nRBC: 0 % (ref 0.0–0.2)

## 2018-12-23 LAB — SARS CORONAVIRUS 2: SARS Coronavirus 2: NOT DETECTED

## 2018-12-23 LAB — ABO/RH: ABO/RH(D): O POS

## 2018-12-23 LAB — NOVEL CORONAVIRUS, NAA (HOSP ORDER, SEND-OUT TO REF LAB; TAT 18-24 HRS): SARS-CoV-2, NAA: NOT DETECTED

## 2018-12-23 LAB — TYPE AND SCREEN
ABO/RH(D): O POS
Antibody Screen: NEGATIVE

## 2018-12-23 LAB — RPR: RPR Ser Ql: NONREACTIVE

## 2018-12-23 MED ORDER — EPHEDRINE 5 MG/ML INJ: 10.0000 mg | INTRAVENOUS | Status: DC | PRN

## 2018-12-23 MED ORDER — ONDANSETRON HCL 4 MG PO TABS: 4.0000 mg | ORAL_TABLET | ORAL | Status: DC | PRN

## 2018-12-23 MED ORDER — LIDOCAINE HCL (PF) 1 % IJ SOLN
30.0000 mL | INTRAMUSCULAR | Status: AC | PRN
  Administered 2018-12-23: 30 mL via SUBCUTANEOUS
  Filled 2018-12-23: qty 30

## 2018-12-23 MED ORDER — SODIUM CHLORIDE (PF) 0.9 % IJ SOLN
INTRAMUSCULAR | Status: DC | PRN
  Administered 2018-12-23: 14 mL/h via EPIDURAL

## 2018-12-23 MED ORDER — PHENYLEPHRINE 40 MCG/ML (10ML) SYRINGE FOR IV PUSH (FOR BLOOD PRESSURE SUPPORT)
80.0000 ug | PREFILLED_SYRINGE | INTRAVENOUS | Status: DC | PRN
  Filled 2018-12-23: qty 10

## 2018-12-23 MED ORDER — LIDOCAINE HCL (PF) 1 % IJ SOLN
INTRAMUSCULAR | Status: DC | PRN
  Administered 2018-12-23: 10 mL via EPIDURAL

## 2018-12-23 MED ORDER — SENNOSIDES-DOCUSATE SODIUM 8.6-50 MG PO TABS
2.0000 | ORAL_TABLET | ORAL | Status: DC
  Administered 2018-12-23: 2 via ORAL
  Filled 2018-12-23: qty 2

## 2018-12-23 MED ORDER — SOD CITRATE-CITRIC ACID 500-334 MG/5ML PO SOLN: 30.0000 mL | ORAL | Status: DC | PRN

## 2018-12-23 MED ORDER — DIPHENHYDRAMINE HCL 25 MG PO CAPS: 25.0000 mg | ORAL_CAPSULE | Freq: Four times a day (QID) | ORAL | Status: DC | PRN

## 2018-12-23 MED ORDER — ONDANSETRON HCL 4 MG/2ML IJ SOLN: 4.0000 mg | Freq: Four times a day (QID) | INTRAMUSCULAR | Status: DC | PRN

## 2018-12-23 MED ORDER — ACETAMINOPHEN 325 MG PO TABS: 650.0000 mg | ORAL_TABLET | ORAL | Status: DC | PRN

## 2018-12-23 MED ORDER — PRENATAL MULTIVITAMIN CH
1.0000 | ORAL_TABLET | Freq: Every day | ORAL | Status: DC
  Administered 2018-12-23 – 2018-12-24 (×2): 1 via ORAL
  Filled 2018-12-23 (×2): qty 1

## 2018-12-23 MED ORDER — IBUPROFEN 600 MG PO TABS
600.0000 mg | ORAL_TABLET | Freq: Four times a day (QID) | ORAL | Status: DC
  Administered 2018-12-23 – 2018-12-24 (×6): 600 mg via ORAL
  Filled 2018-12-23 (×6): qty 1

## 2018-12-23 MED ORDER — SIMETHICONE 80 MG PO CHEW: 80.0000 mg | CHEWABLE_TABLET | ORAL | Status: DC | PRN

## 2018-12-23 MED ORDER — PHENYLEPHRINE 40 MCG/ML (10ML) SYRINGE FOR IV PUSH (FOR BLOOD PRESSURE SUPPORT): 80.0000 ug | PREFILLED_SYRINGE | INTRAVENOUS | Status: DC | PRN

## 2018-12-23 MED ORDER — ONDANSETRON HCL 4 MG/2ML IJ SOLN: 4.0000 mg | INTRAMUSCULAR | Status: DC | PRN

## 2018-12-23 MED ORDER — LACTATED RINGERS IV SOLN: 500.0000 mL | INTRAVENOUS | Status: DC | PRN

## 2018-12-23 MED ORDER — TETANUS-DIPHTH-ACELL PERTUSSIS 5-2.5-18.5 LF-MCG/0.5 IM SUSP: 0.5000 mL | Freq: Once | INTRAMUSCULAR | Status: DC

## 2018-12-23 MED ORDER — OXYTOCIN BOLUS FROM INFUSION
500.0000 mL | Freq: Once | INTRAVENOUS | Status: AC
  Administered 2018-12-23: 500 mL via INTRAVENOUS

## 2018-12-23 MED ORDER — DIBUCAINE (PERIANAL) 1 % EX OINT: 1.0000 "application " | TOPICAL_OINTMENT | CUTANEOUS | Status: DC | PRN

## 2018-12-23 MED ORDER — OXYCODONE HCL 5 MG PO TABS: 10.0000 mg | ORAL_TABLET | ORAL | Status: DC | PRN

## 2018-12-23 MED ORDER — OXYCODONE-ACETAMINOPHEN 5-325 MG PO TABS: 2.0000 | ORAL_TABLET | ORAL | Status: DC | PRN

## 2018-12-23 MED ORDER — DIPHENHYDRAMINE HCL 50 MG/ML IJ SOLN: 12.5000 mg | INTRAMUSCULAR | Status: DC | PRN

## 2018-12-23 MED ORDER — ZOLPIDEM TARTRATE 5 MG PO TABS: 5.0000 mg | ORAL_TABLET | Freq: Every evening | ORAL | Status: DC | PRN

## 2018-12-23 MED ORDER — OXYTOCIN 40 UNITS IN NORMAL SALINE INFUSION - SIMPLE MED
2.5000 [IU]/h | INTRAVENOUS | Status: DC
  Filled 2018-12-23: qty 1000

## 2018-12-23 MED ORDER — WITCH HAZEL-GLYCERIN EX PADS: 1.0000 "application " | MEDICATED_PAD | CUTANEOUS | Status: DC | PRN

## 2018-12-23 MED ORDER — FENTANYL-BUPIVACAINE-NACL 0.5-0.125-0.9 MG/250ML-% EP SOLN
12.0000 mL/h | EPIDURAL | Status: DC | PRN
  Filled 2018-12-23: qty 250

## 2018-12-23 MED ORDER — BENZOCAINE-MENTHOL 20-0.5 % EX AERO
1.0000 "application " | INHALATION_SPRAY | CUTANEOUS | Status: DC | PRN
  Administered 2018-12-23: 1 via TOPICAL
  Filled 2018-12-23: qty 56

## 2018-12-23 MED ORDER — OXYCODONE-ACETAMINOPHEN 5-325 MG PO TABS: 1.0000 | ORAL_TABLET | ORAL | Status: DC | PRN

## 2018-12-23 MED ORDER — LACTATED RINGERS IV SOLN: INTRAVENOUS | Status: DC

## 2018-12-23 MED ORDER — OXYCODONE HCL 5 MG PO TABS: 5.0000 mg | ORAL_TABLET | ORAL | Status: DC | PRN

## 2018-12-23 MED ORDER — COCONUT OIL OIL: 1.0000 "application " | TOPICAL_OIL | Status: DC | PRN

## 2018-12-23 MED ORDER — FLEET ENEMA 7-19 GM/118ML RE ENEM: 1.0000 | ENEMA | RECTAL | Status: DC | PRN

## 2018-12-23 MED ORDER — LACTATED RINGERS IV SOLN
500.0000 mL | Freq: Once | INTRAVENOUS | Status: DC
  Administered 2018-12-23: 500 mL via INTRAVENOUS

## 2018-12-23 NOTE — Anesthesia Procedure Notes (Signed)
Epidural Patient location during procedure: OB Start time: 12/23/2018 1:00 AM End time: 12/23/2018 1:11 AM  Staffing Anesthesiologist: Lidia Collum, MD Performed: anesthesiologist   Preanesthetic Checklist Completed: patient identified, pre-op evaluation, timeout performed, IV checked, risks and benefits discussed and monitors and equipment checked  Epidural Patient position: sitting Prep: DuraPrep Patient monitoring: heart rate, continuous pulse ox and blood pressure Approach: midline Location: L3-L4 Injection technique: LOR air  Needle:  Needle type: Tuohy  Needle gauge: 17 G Needle length: 9 cm Needle insertion depth: 5 cm Catheter type: closed end flexible Catheter size: 19 Gauge Catheter at skin depth: 10 cm Test dose: negative  Assessment Events: blood not aspirated, injection not painful, no injection resistance, negative IV test and no paresthesia  Additional Notes Reason for block:procedure for pain

## 2018-12-23 NOTE — Anesthesia Preprocedure Evaluation (Signed)

## 2018-12-23 NOTE — H&P (Signed)
Ruth Mccarthy is a 48 y.T.[id number] female presenting at 45 4/7wks  for active labor. Pt reports intermittent ctxs most of yesterday but got strong and close together at 1030pm. Pt was 7cm on arrival at MAU and progressed quickly after epidural. She also spontaneously ruptured with thick meconium fluid noted.  Pt is dated per LMP; confirmed with an 8week [place]. Early noted previa was confirmed resolved in April. She declined genetic and chromosomal screening. She failed her 1hr but 3 hr was normal. She is GBS negative.  OB History    Gravida  4   Para  2   Term  2   Preterm  0   AB  1   Living  2     SAB  1   TAB  0   Ectopic  0   Multiple  0   Live Births  2          Past Medical History:  Diagnosis Date  . Broken finger   . No pertinent past medical history   . SVD (spontaneous vaginal delivery) 01/08/2014   Past Surgical History:  Procedure Laterality Date  . EYE SURGERY    . NO PAST SURGERIES    . WISDOM TOOTH EXTRACTION     Family History: family history includes Alcohol abuse in her maternal uncle and mother; Cancer in her mother; Diabetes in her maternal grandfather; Hyperlipidemia in her maternal grandmother; Hypertension in her maternal grandfather and mother; Stroke in her maternal grandfather; Vision loss in her maternal grandmother. Social History:  reports that she has never smoked. She has never used smokeless tobacco. She reports that she does not drink alcohol or use drugs.     Maternal Diabetes: No Genetic Screening: Declined Maternal Ultrasounds/Referrals: Normal Fetal Ultrasounds or other Referrals:  None Maternal Substance Abuse:  No Significant Maternal Medications:  None Significant Maternal Lab Results:  Group B Strep negative Other Comments:  None  Review of Systems  Constitutional: Positive for malaise/fatigue. Negative for chills, fever and weight loss.  Eyes: Negative for blurred vision and double vision.  Respiratory: Negative for  shortness of breath.   Cardiovascular: Negative for chest pain.  Gastrointestinal: Positive for abdominal pain. Negative for heartburn, nausea and vomiting.  Genitourinary: Negative for dysuria.  Musculoskeletal: Positive for myalgias.  Skin: Negative for itching and rash.  Neurological: Negative for dizziness and headaches.  Endo/Heme/Allergies: Negative for environmental allergies. Does not bruise/bleed easily.  Psychiatric/Behavioral: Negative for depression, hallucinations, substance abuse and suicidal ideas. The patient is nervous/anxious.    Maternal Medical History:  Reason for admission: Contractions.  Nausea.  Contractions: Onset was 3-5 hours ago.   Frequency: regular.   Perceived severity is strong.    Fetal activity: Perceived fetal activity is normal.   Last perceived fetal movement was within the past hour.    Prenatal complications: no prenatal complications Prenatal Complications - Diabetes: none.    Dilation: 10 Effacement (%): 100 Station: Plus 1 Exam by::  MD  Blood pressure 109/73, pulse 88, temperature 98 F (36.7 C), temperature source Oral, resp. rate 18, height 5\' 7"  (1.702 m), weight 90.7 kg, SpO2 100 %, unknown if currently breastfeeding. Maternal Exam:  Uterine Assessment: Contraction strength is firm.  Contraction frequency is regular.   Abdomen: Patient reports generalized tenderness.  Estimated fetal weight is AGA.   Fetal presentation: vertex  Introitus: Normal vulva. Vulva is negative for condylomata and lesion.  Normal vagina.  Vagina is negative for condylomata and discharge.  Amniotic fluid  character: meconium stained.  Pelvis: adequate for delivery.   Cervix: Cervix evaluated by digital exam.     Fetal Exam Fetal Monitor Review: Baseline rate: 140.  Variability: moderate (6-25 bpm).   Pattern: accelerations present and no decelerations.    Fetal State Assessment: Category I - tracings are normal.     Physical Exam   Constitutional: She is oriented to person, place, and time. She appears well-developed and well-nourished.  Neck: Normal range of motion.  Cardiovascular: Normal rate.  Respiratory: Effort normal.  GI: Soft. There is generalized abdominal tenderness.  Genitourinary:    Vulva, vagina and uterus normal.     No vulval condylomata or lesion noted.     No vaginal discharge.   Musculoskeletal: Normal range of motion.  Neurological: She is alert and oriented to person, place, and time.  Skin: Skin is warm.  Psychiatric: She has a normal mood and affect. Her behavior is normal. Judgment and thought content normal.    Prenatal labs: ABO, Rh: --/--/O POS, O POS Performed at [facility], [address] [place], [place] [address]  [phone](06/16 0037) Antibody: NEG (06/16 0037) Rubella:   RPR:    HBsAg:    HIV:    GBS:     Assessment/Plan: 29BM [id number] G4P2012 female in active labor at 4640 4/[redacted]wks gestation Epidural for pain control GBS neg SROM - mec thick Imminent svd    Janean SarkCecilia W  12/23/2018, 2:01 AM

## 2018-12-23 NOTE — Lactation Note (Signed)
This note was copied from a baby's chart. Lactation Consultation Note  Patient Name: Ruth Mccarthy [id number] Date: 12/23/2018 Reason for consult: Initial assessment;Term P3, 2 hour female infant. Per mom, she was tired and infant did not latch in L&D.  Per mom, she think infant had a soiled diaper after delivery.  Mom is experienced in breastfeeding she breastfeed her 1st child for 2 years and her almost 33 year old for 6 months. Per mom, she has DEBP at home. LC reviewed hand expression and mom taught back infant was given 2 ml of colostrum by spoon.  Mom latched infant on right breast using cross cradle hold, infant latched with wide mouth gape and swallowing observed infant was still breastfeeding (5 minutes) when LC left the room. Mom knows to breastfeed according hunger cues, 8 to 12 times within 24 hours and on demand. LC discussed I & O. Mom knows to call Nurse or [place] if she has any questions, concerns or need assistance with latching infant to breast. Mom made aware of O/P services, breastfeeding support groups, community resources, and our phone # for post-discharge questions.   Maternal Data Formula Feeding for Exclusion: No Has patient been taught Hand Expression?: Yes(Infant given 2 ml of colostrum by spoon.) Does the patient have breastfeeding experience prior to this delivery?: Yes  Feeding Feeding Type: Breast Fed  LATCH Score Latch: Grasps breast easily, tongue down, lips flanged, rhythmical sucking.  Audible Swallowing: Spontaneous and intermittent  Type of Nipple: Everted at rest and after stimulation  Comfort (Breast/Nipple): Soft / non-tender  Hold (Positioning): Assistance needed to correctly position infant at breast and maintain latch.  LATCH Score: 9  Interventions Interventions: Breast feeding basics reviewed;Breast compression;Assisted with latch;Adjust position;Skin to skin;Support pillows;Breast massage;Position options;Hand express  Lactation  Tools Discussed/Used WIC Program: No   Consult Status Consult Status: Follow-up Date: 12/23/18 Follow-up type: In-patient    Vicente Serene 12/23/2018, 4:25 AM

## 2018-12-23 NOTE — Anesthesia Postprocedure Evaluation (Signed)
Anesthesia Post Note  Patient: Ruth Mccarthy  Procedure(s) Performed: AN AD [place]     Patient location during evaluation: Mother Baby Anesthesia Type: Epidural Level of consciousness: awake and alert Pain management: pain level controlled Vital Signs Assessment: post-procedure vital signs reviewed and stable Respiratory status: spontaneous breathing, nonlabored ventilation and respiratory function stable Cardiovascular status: stable Postop Assessment: no headache, no backache and epidural receding Anesthetic complications: no    Last Vitals:  Vitals:   12/23/18 0347 12/23/18 0449  BP: 105/78 116/79  Pulse: 65 86  Resp: 16 16  Temp: 36.5 C 36.6 C  SpO2: 99% 98%    Last Pain:  Vitals:   12/23/18 0630  TempSrc:   PainSc: 0-No pain   Pain Goal: Patients Stated Pain Goal: 4 (12/23/18 0315)                 Drucie Opitz

## 2018-12-23 NOTE — Lactation Note (Signed)
This note was copied from a baby's chart. Lactation Consultation Note  Patient Name: Ruth Mccarthy [id number] Date: 12/23/2018 Reason for consult: Follow-up assessment;Term  P3 mother whose infant is now 38 hours old.  Mother breast fed her first child for 2 years and her second child for 6 months.  Mother had just finished breast feeding when I arrived.  She feels like baby has been latching better to the right breast.  After observation, both breasts are soft and non tender and nipples are everted and intact.  The left nipple is slightly reddened.  Provided comfort gels with instructions for use.  Encouraged mother to rub EBM in nipples/areolas for lubrication.  Mother verbalized understanding.    Mother will continue to feed 8-12 times/24 hours or sooner if baby shows cues.  Suggested hand expression before/after feedings to help increase milk supply.    Encouraged mother to call for latch assistance as needed.  Father present.   Maternal Data Formula Feeding for Exclusion: No Has patient been taught Hand Expression?: Yes Does the patient have breastfeeding experience prior to this delivery?: Yes  Feeding Feeding Type: Breast Fed  LATCH Score Latch: Repeated attempts needed to sustain latch, nipple held in mouth throughout feeding, stimulation needed to elicit sucking reflex.  Audible Swallowing: A few with stimulation  Type of Nipple: Everted at rest and after stimulation  Comfort (Breast/Nipple): Soft / non-tender  Hold (Positioning): No assistance needed to correctly position infant at breast.  LATCH Score: 8  Interventions    Lactation Tools Discussed/Used     Consult Status Consult Status: Follow-up Date: 12/24/18 Follow-up type: In-patient    Mathea Frieling R Haston Casebolt 12/23/2018, 5:09 PM

## 2018-12-23 NOTE — Progress Notes (Signed)
Post Partum Day 1 Subjective: no complaints, up ad lib, voiding, tolerating PO and nl lochia, pain controlled  Objective: Blood pressure 116/79, pulse 86, temperature 97.8 F (36.6 C), temperature source Oral, resp. rate 16, height 5\' 7"  (1.702 m), weight 90.7 kg, SpO2 98 %, unknown if currently breastfeeding.  Physical Exam:  General: alert and no distress Lochia: appropriate Uterine Fundus: firm   Recent Labs    12/23/18 0027 12/23/18 0434  HGB 12.9 12.5  HCT 38.4 38.4    Assessment/Plan: Plan for discharge tomorrow, Breastfeeding and Lactation consult.  Routine PP care.     LOS: 0 days   Chanese Hartsough Bovard-Stuckert 12/23/2018, 8:18 AM

## 2018-12-24 ENCOUNTER — Inpatient Hospital Stay (HOSPITAL_COMMUNITY): Admission: RE | Admit: 2018-12-24 | Source: Ambulatory Visit

## 2018-12-24 MED ORDER — IBUPROFEN 600 MG PO TABS: 600.0000 mg | ORAL_TABLET | Freq: Four times a day (QID) | ORAL | 0 refills | Status: DC

## 2018-12-24 NOTE — Progress Notes (Signed)
CSW received consult for hx of Anxiety.  CSW met with MOB to offer support and complete assessment.    CSW entered the room and congratulated MOB on the birth of infant. CSW advised MOB of the reason for the visit as well as of HIPPA policy. MOB expressed that she was fine with husband staying in the room while CSW spoke. CSW understanding and proceeded with assessing MOB.   MOB expressed that was never formally diagnosed with anxiety but has days where she feels very anxious. CSW was advised that MOB started having these symptoms once she started having children. MOB expressed that she is not on meds or in therapy and that she has been managing symptoms fine. CSW offered MOB resources for therapy and MOB declined at this time. MOB expressed that she has been feeling fine since giving birth and denies feeling SI or HI.   MOB reported that she has all needed items to care for infant at this time. Mob desires for infant to be seen at [facility] for further care.   CSW provided education regarding the baby blues period vs. perinatal mood disorders, discussed treatment and gave resources for mental health follow up if concerns arise.  CSW recommends self-evaluation during the postpartum time period using the New Mom Checklist from Postpartum Progress and encouraged MOB to contact a medical professional if symptoms are noted at any time.   CSW provided review of Sudden Infant Death Syndrome (SIDS) precautions.   CSW identifies no further need for intervention and no barriers to discharge at this time.    Ruth Mccarthy, MSW, LCSW-A [facility] [place] [facility] [place] [phone]

## 2018-12-24 NOTE — Progress Notes (Signed)
PPD #1 No problems, wants to go home Afeb, VSS Fundus firm, NT at U-1 Continue routine postpartum care, d/c home 

## 2018-12-24 NOTE — Discharge Summary (Signed)
    OB Discharge Summary     Patient Name: Ruth Mccarthy DOB: 1986-05-24 MRN: [id number]  Date of admission: 12/23/2018 Delivering MD: Carlynn Purl [facility]   Date of discharge: 12/24/2018  Admitting diagnosis: 18.3WKS CTX Intrauterine pregnancy: [redacted]w[redacted]d     Secondary diagnosis:  Active Problems:   Spontaneous vaginal delivery   Postpartum care following vaginal delivery      Discharge diagnosis: Term Pregnancy [place] course:  Onset of Labor With Vaginal Delivery     33 y.o. yo [id number] at [redacted]w[redacted]d was admitted in Active Labor on 12/23/2018. Patient had an uncomplicated labor course as follows:  Membrane Rupture Time/Date: 1:09 AM ,12/23/2018   Intrapartum Procedures: Episiotomy: None [1]                                         Lacerations:  2nd degree [3]  Patient had a delivery of a Viable infant. 12/23/2018  Information for the patient's newborn:  Lavonda, Thal [[id number]]  Delivery Method: Vag-Spont     Pateint had an uncomplicated postpartum course.  She is ambulating, tolerating a regular diet, passing flatus, and urinating well. Patient is discharged home in stable condition on 12/24/18.   Physical exam  Vitals:   12/23/18 1242 12/23/18 1630 12/23/18 2148 12/24/18 0544  BP: 104/75 109/70 111/70 102/67  Pulse: 75 76 75 70  Resp: 18 16 18 17   Temp: (!) 97.5 F (36.4 C) 97.7 F (36.5 C) 98.6 F (37 C) 98 F (36.7 C)  TempSrc: Oral Oral Oral Oral  SpO2:      Weight:      Height:       General: alert Lochia: appropriate Uterine Fundus: firm  Labs: Lab Results  Component Value Date   WBC 13.2 (H) 12/23/2018   HGB 12.5 12/23/2018   HCT 38.4 12/23/2018   MCV 90.4 12/23/2018   PLT 232 12/23/2018   No flowsheet data found.  Discharge instruction: per After Visit Summary and "Baby and Me Booklet".  After visit meds:  Allergies as of 12/24/2018      Reactions   Kiwi Extract Itching      Medication List    TAKE  these medications   acetaminophen 325 MG tablet Commonly known as: TYLENOL Take 325 mg by mouth every 6 (six) hours as needed for mild pain or headache.   ibuprofen 600 MG tablet Commonly known as: ADVIL Take 1 tablet (600 mg total) by mouth every 6 (six) hours.   prenatal multivitamin Tabs tablet Take 1 tablet by mouth daily.       Diet: routine diet  Activity: Advance as tolerated. Pelvic rest for 6 weeks.   Outpatient follow up:6 weeks  Newborn Data: Live born female  Birth Weight: 6 lb 10 oz (3005 g) APGAR: 51, 9  Newborn Delivery   Birth date/time: 12/23/2018 01:36:00 Delivery type: Vaginal, Spontaneous      Baby Feeding: Breast Disposition:home with mother   12/24/2018 Clarene Duke, MD

## 2018-12-24 NOTE — Discharge Instructions (Signed)
As per discharge pamphlet °

## 2018-12-24 NOTE — Lactation Note (Signed)
This note was copied from a baby's chart. Lactation Consultation Note  Patient Name: Girl Natash Berman ERDEY'C Date: 12/24/2018 Reason for consult: Follow-up assessment;Term Baby is 33 hours old/4% weight loss.  Baby has only had one void and one stool since birth.  Mom feels like feedings are going well on the right breast.  She states I don't think baby likes the left.  Mom has an area of soft swelling approximately 3 cm on left areola at 9:00.  The area is not red or tender.  Mom states she has noticed it recently but it was smaller.  Observed baby latch to left and she did have some difficulty sustaining the latch.  Baby fed well on right breast.  If area does not resolve soon instructed mom to be seen by MD for evaluation.  Heat applied to area prior to pumping.  Symphony pump set up and initiated for stimulation.  Instructed to breastfeed with any feeding cue and post pump for 15 minutes every 3 hours.  Heat and massage to swelling on left side.  Encouraged to call for assist prn.  Maternal Data    Feeding Feeding Type: Breast Fed  LATCH Score Latch: Grasps breast easily, tongue down, lips flanged, rhythmical sucking.  Audible Swallowing: A few with stimulation  Type of Nipple: Everted at rest and after stimulation  Comfort (Breast/Nipple): Soft / non-tender  Hold (Positioning): No assistance needed to correctly position infant at breast.  LATCH Score: 9  Interventions    Lactation Tools Discussed/Used Pump Review: Setup, frequency, and cleaning;Milk Storage Initiated by:: LMoulden Date initiated:: 12/24/18   Consult Status Consult Status: Follow-up Date: 12/25/18 Follow-up type: In-patient    Ave Filter 12/24/2018, 11:35 AM

## 2020-01-25 ENCOUNTER — Encounter: Payer: Self-pay | Admitting: Family

## 2020-01-25 ENCOUNTER — Telehealth: Payer: Self-pay | Admitting: Family

## 2020-01-25 ENCOUNTER — Ambulatory Visit (INDEPENDENT_AMBULATORY_CARE_PROVIDER_SITE_OTHER): Admitting: Family

## 2020-01-25 ENCOUNTER — Other Ambulatory Visit: Payer: Self-pay

## 2020-01-25 VITALS — BP 115/81 | HR 72 | Temp 98.3°F | Resp 16 | Ht 66.3 in | Wt 182.0 lb

## 2020-01-25 DIAGNOSIS — Z0000 Encounter for general adult medical examination without abnormal findings: Secondary | ICD-10-CM | POA: Diagnosis not present

## 2020-01-25 LAB — CBC WITH DIFFERENTIAL/PLATELET
Basophils Absolute: 0 10*3/uL (ref 0.0–0.1)
Basophils Relative: 0.8 % (ref 0.0–3.0)
Eosinophils Absolute: 0.1 10*3/uL (ref 0.0–0.7)
Eosinophils Relative: 2.3 % (ref 0.0–5.0)
HCT: 39.6 % (ref 36.0–46.0)
Hemoglobin: 13.1 g/dL (ref 12.0–15.0)
Lymphocytes Relative: 40.5 % (ref 12.0–46.0)
Lymphs Abs: 1.9 10*3/uL (ref 0.7–4.0)
MCHC: 33 g/dL (ref 30.0–36.0)
MCV: 89.4 fl (ref 78.0–100.0)
Monocytes Absolute: 0.3 10*3/uL (ref 0.1–1.0)
Monocytes Relative: 6.1 % (ref 3.0–12.0)
Neutro Abs: 2.4 10*3/uL (ref 1.4–7.7)
Neutrophils Relative %: 50.3 % (ref 43.0–77.0)
Platelets: 262 10*3/uL (ref 150.0–400.0)
RBC: 4.43 Mil/uL (ref 3.87–5.11)
RDW: 13.2 % (ref 11.5–15.5)
WBC: 4.7 10*3/uL (ref 4.0–10.5)

## 2020-01-25 LAB — LIPID PANEL
Cholesterol: 205 mg/dL — ABNORMAL HIGH (ref 0–200)
HDL: 62.5 mg/dL (ref >39.00)
LDL Cholesterol: 125 mg/dL — ABNORMAL HIGH (ref 0–99)
NonHDL: 142.35
Total CHOL/HDL Ratio: 3
Triglycerides: 89 mg/dL (ref 0.0–149.0)
VLDL: 17.8 mg/dL (ref 0.0–40.0)

## 2020-01-25 LAB — BASIC METABOLIC PANEL
BUN: 17 mg/dL (ref 6–23)
CO2: 27 mEq/L (ref 19–32)
Calcium: 9.8 mg/dL (ref 8.4–10.5)
Chloride: 105 mEq/L (ref 96–112)
Creatinine, Ser: 0.73 mg/dL (ref 0.40–1.20)
GFR: 91.01 mL/min (ref >60.00)
Glucose, Bld: 85 mg/dL (ref 70–99)
Potassium: 4.8 mEq/L (ref 3.5–5.1)
Sodium: 140 mEq/L (ref 135–145)

## 2020-01-25 LAB — HEPATIC FUNCTION PANEL
ALT: 11 U/L (ref 0–35)
AST: 13 U/L (ref 0–37)
Albumin: 4.7 g/dL (ref 3.5–5.2)
Alkaline Phosphatase: 81 U/L (ref 39–117)
Bilirubin, Direct: 0.1 mg/dL (ref 0.0–0.3)
Total Bilirubin: 0.6 mg/dL (ref 0.2–1.2)
Total Protein: 6.9 g/dL (ref 6.0–8.3)

## 2020-01-25 LAB — TSH: TSH: 1.82 u[IU]/mL (ref 0.35–4.50)

## 2020-01-25 NOTE — Progress Notes (Signed)
Subjective:    Patient ID: Ruth Mccarthy, female    DOB: 29-Oct-1985, 34 y.o.   MRN: [id number]  HPI  Patient is a 34 yr old female who presents today to establish care.   Reports hx of "knots in her armpits and groin area."  Was treated with keflex and doxy.   Patient presents today for complete physical.  Immunizations:  Diet:  Diet is "OK" should eat less.  Drinks Dr. Reino Kent- <1 bottle a day Exercise: not exercising regularly[facility] Smear: up to date Dental: up to date Vision: due  Wt Readings from Last 3 Encounters:  01/25/20 182 lb (82.6 kg)  12/23/18 199 lb 15.3 oz (90.7 kg)  01/08/14 175 lb (79.4 kg)     Review of Systems  Constitutional: Negative for unexpected weight change.  Respiratory: Negative for cough.   Cardiovascular: Negative for leg swelling.  Gastrointestinal: Negative for constipation and diarrhea.  Genitourinary: Negative for dysuria, frequency, hematuria and menstrual problem (has nexplanon- no menses).  Musculoskeletal: Negative for arthralgias and myalgias.  Skin: Negative for rash.  Neurological: Positive for headaches (sometimes when she is working a lot).  Hematological: Negative for adenopathy.  Psychiatric/Behavioral:       Denies depression/anxiety   Past Medical History:  Diagnosis Date  . Broken finger   . No pertinent past medical history   . Spontaneous vaginal delivery 12/23/2018  . SVD (spontaneous vaginal delivery) 01/08/2014     Social History   Socioeconomic History  . Marital status: Married    Spouse name: Not on file  . Number of children: 3  . Years of education: Not on file  . Highest education level: Not on file  Occupational History  . Occupation: Runner, broadcasting/film/video  Tobacco Use  . Smoking status: Never Smoker  . Smokeless tobacco: Never Used  Vaping Use  . Vaping Use: Never used  Substance and Sexual Activity  . Alcohol use: No  . Drug use: No  . Sexual activity: Yes    Partners: Male  Other Topics  Concern  . Not on file  Social History Narrative   Daughter- 2013 Shea Evans   [id number]   Daughter-2020 Zollie Scale   Married   Works as a Electrical engineer   Completed bachelors   Dog ([place]orkie- Cytogeneticist)   Enjoys playing games on her phone   Social Determinants of Health   Financial Resource Strain:   . Difficulty of Paying Living Expenses:   Food Insecurity:   . Worried About Programme researcher, broadcasting/film/video in the Last Year:   . Barista in the Last Year:   Transportation Needs:   . Freight forwarder (Medical):   Marland Kitchen Lack of Transportation (Non-Medical):   Physical Activity: Insufficiently Active  . Days of Exercise per Week: 1 day  . Minutes of Exercise per Session: 60 min  Stress:   . Feeling of Stress :   Social Connections:   . Frequency of Communication with Friends and Family:   . Frequency of Social Gatherings with Friends and Family:   . Attends Religious Services:   . Active Member of Clubs or Organizations:   . Attends Banker Meetings:   Marland Kitchen Marital Status:   Intimate Partner Violence:   . Fear of Current or Ex-Partner:   . Emotionally Abused:   Marland Kitchen Physically Abused:   . Sexually Abused:     Past Surgical History:  Procedure Laterality Date  .  EYE SURGERY     lasix  . NO PAST SURGERIES    . WISDOM TOOTH EXTRACTION      Family History  Problem Relation Age of Onset  . Alcohol abuse Mother   . Cancer Mother        cervix  . Hypertension Mother   . Alcohol abuse Maternal Uncle   . Hyperlipidemia Maternal Grandmother   . Vision loss Maternal Grandmother   . Colon polyps Maternal Grandmother   . Diabetes Maternal Grandfather   . Hypertension Maternal Grandfather   . Stroke Maternal Grandfather   . Anesthesia problems Neg Hx     Allergies  Allergen Reactions  . Kiwi Extract Itching  . Shrimp [Shellfish Allergy] Diarrhea and Nausea And Vomiting    Current Outpatient Medications on File Prior to Visit  Medication Sig Dispense  Refill  . ibuprofen (ADVIL) 600 MG tablet Take 1 tablet (600 mg total) by mouth every 6 (six) hours. 30 tablet 0  . Multiple Vitamins-Calcium (ONE-A-DAY WOMENS FORMULA) TABS Take by mouth.     No current facility-administered medications on file prior to visit.    BP 115/81 (BP Location: Right Arm, Patient Position: Sitting, Cuff Size: Small)   Pulse 72   Temp 98.3 F (36.8 C) (Oral)   Resp 16   Ht 5' 6.3" (1.684 m)   Wt 182 lb (82.6 kg)   SpO2 100%   BMI 29.11 kg/m       Objective:   Physical Exam  Physical Exam  Constitutional: She is oriented to person, place, and time. She appears well-developed and well-nourished. No distress.  HENT:  Head: Normocephalic and atraumatic.  Right Ear: Tympanic membrane and ear canal normal.  Left Ear: Tympanic membrane and ear canal normal.  Mouth/Throat: Not examined- pt wearing mask Eyes: Pupils are equal, round, and reactive to light. No scleral icterus.  Neck: Normal range of motion. No thyromegaly present.  Cardiovascular: Normal rate and regular rhythm.   No murmur heard. Pulmonary/Chest: Effort normal and breath sounds normal. No respiratory distress. He has no wheezes. She has no rales. She exhibits no tenderness.  Abdominal: Soft. Bowel sounds are normal. She exhibits no distension and no mass. There is no tenderness. There is no rebound and no guarding.  Musculoskeletal: She exhibits no edema.  Lymphadenopathy:    She has no cervical adenopathy.  Neurological: She is alert and oriented to person, place, and time. She has normal patellar reflexes. She exhibits normal muscle tone. Coordination normal.  Skin: Skin is warm and dry.  Psychiatric: She has a normal mood and affect. Her behavior is normal. Judgment and thought content normal.  Breasts: Examined lying Right: Without masses, retractions, discharge. Mobile pea sized tender lymph node in right axilla Left: Without masses, retractions, discharge or axillary adenopathy.    Pelvic: deferred         Assessment & Plan:  Preventative care- discussed healthy diet, exercise, weight loss, importance of covid vaccination (pt declines). Tetanus up to date. Will request pap results from gyn.  Right axillary lymph node- reports that she first noticed this after a bug bite on her arm that got swollen. Complete abx. Plan to bring her back in 1 month for recheck.  This visit occurred during the SARS-CoV-2 public health emergency.  Safety protocols were in place, including screening questions prior to the visit, additional usage of staff PPE, and extensive cleaning of exam room while observing appropriate contact time as indicated for disinfecting solutions.  Assessment & Plan:

## 2020-01-25 NOTE — Telephone Encounter (Signed)
Can you please call Dr. Eligha Bridegroom office and request copy of pap?

## 2020-01-25 NOTE — Patient Instructions (Addendum)
Please schedule a routine eye exam. Try to work up to 30 minutes 5 days a week of exercise. Continue to work on [employer] and exercise.  Welcome to [employer]!   Preventive Care 61-34 Years Old, Female Preventive care refers to visits with your health care provider and lifestyle choices that can promote health and wellness. This includes:  A yearly physical exam. This may also be called an annual well check.  Regular dental visits and eye exams.  Immunizations.  Screening for certain conditions.  Healthy lifestyle choices, such as eating a healthy diet, getting regular exercise, not using drugs or products that contain nicotine and tobacco, and limiting alcohol use. What can I expect for my preventive care visit? Physical exam Your health care provider will check your:  Height and weight. This may be used to calculate body mass index (BMI), which tells if you are at a healthy weight.  Heart rate and blood pressure.  Skin for abnormal spots. Counseling Your health care provider may ask you questions about your:  Alcohol, tobacco, and drug use.  Emotional well-being.  Home and relationship well-being.  Sexual activity.  Eating habits.  Work and work Statistician.  Method of birth control.  Menstrual cycle.  Pregnancy history. What immunizations do I need?  Influenza (flu) vaccine  This is recommended every year. Tetanus, diphtheria, and pertussis (Tdap) vaccine  You may need a Td booster every 10 years. Varicella (chickenpox) vaccine  You may need this if you have not been vaccinated. Human papillomavirus (HPV) vaccine  If recommended by your health care provider, you may need three doses over 6 months. Measles, mumps, and rubella (MMR) vaccine  You may need at least one dose of MMR. You may also need a second dose. Meningococcal conjugate (MenACWY) vaccine  One dose is recommended if you are age 33-21 years and a first-year college student living in a  residence hall, or if you have one of several medical conditions. You may also need additional booster doses. Pneumococcal conjugate (PCV13) vaccine  You may need this if you have certain conditions and were not previously vaccinated. Pneumococcal polysaccharide (PPSV23) vaccine  You may need one or two doses if you smoke cigarettes or if you have certain conditions. Hepatitis A vaccine  You may need this if you have certain conditions or if you travel or work in places where you may be exposed to hepatitis A. Hepatitis B vaccine  You may need this if you have certain conditions or if you travel or work in places where you may be exposed to hepatitis B. Haemophilus influenzae type b (Hib) vaccine  You may need this if you have certain conditions. You may receive vaccines as individual doses or as more than one vaccine together in one shot (combination vaccines). Talk with your health care provider about the risks and benefits of combination vaccines. What tests do I need?  Blood tests  Lipid and cholesterol levels. These may be checked every 5 years starting at age 4.  Hepatitis C test.  Hepatitis B test. Screening  Diabetes screening. This is done by checking your blood sugar (glucose) after you have not eaten for a while (fasting).  Sexually transmitted disease (STD) testing.  BRCA-related cancer screening. This may be done if you have a family history of breast, ovarian, tubal, or peritoneal cancers.  Pelvic exam and Pap test. This may be done every 3 years starting at age 27. Starting at age 14, this may be done every 5  years if you have a Pap test in combination with an HPV test. Talk with your health care provider about your test results, treatment options, and if necessary, the need for more tests. Follow these instructions at home: Eating and drinking   Eat a diet that includes fresh fruits and vegetables, whole grains, lean protein, and low-fat dairy.  Take vitamin  and mineral supplements as recommended by your health care provider.  Do not drink alcohol if: ? Your health care provider tells you not to drink. ? You are pregnant, may be pregnant, or are planning to become pregnant.  If you drink alcohol: ? Limit how much you have to 0-1 drink a day. ? Be aware of how much alcohol is in your drink. In the [place] one drink equals one 12 oz bottle of beer (355 mL), one 5 oz glass of wine (148 mL), or one 1 oz glass of hard liquor (44 mL). Lifestyle  Take daily care of your teeth and gums.  Stay active. Exercise for at least 30 minutes on 5 or more days each week.  Do not use any products that contain nicotine or tobacco, such as cigarettes, e-cigarettes, and chewing tobacco. If you need help quitting, ask your health care provider.  If you are sexually active, practice safe sex. Use a condom or other form of birth control (contraception) in order to prevent pregnancy and STIs (sexually transmitted infections). If you plan to become pregnant, see your health care provider for a preconception visit. What's next?  Visit your health care provider once a year for a well check visit.  Ask your health care provider how often you should have your eyes and teeth checked.  Stay up to date on all vaccines. This information is not intended to replace advice given to you by your health care provider. Make sure you discuss any questions you have with your health care provider. Document Revised: 03/06/2018 Document Reviewed: 03/06/2018 Elsevier Patient Education  2020 [employer].

## 2020-01-25 NOTE — Telephone Encounter (Signed)
Records release request faxed 

## 2020-01-26 ENCOUNTER — Encounter: Payer: Self-pay | Admitting: Family

## 2020-01-28 NOTE — Progress Notes (Signed)
Mailed out to pt 

## 2020-02-26 ENCOUNTER — Ambulatory Visit: Admitting: Family

## 2020-08-31 ENCOUNTER — Other Ambulatory Visit: Payer: Self-pay | Admitting: Obstetrics and Gynecology

## 2020-08-31 DIAGNOSIS — N644 Mastodynia: Secondary | ICD-10-CM

## 2020-08-31 DIAGNOSIS — N63 Unspecified lump in unspecified breast: Secondary | ICD-10-CM

## 2020-09-27 ENCOUNTER — Encounter

## 2020-09-27 ENCOUNTER — Other Ambulatory Visit

## 2020-09-30 ENCOUNTER — Other Ambulatory Visit: Payer: Self-pay | Admitting: Obstetrics and Gynecology

## 2020-09-30 ENCOUNTER — Ambulatory Visit
Admission: RE | Admit: 2020-09-30 | Discharge: 2020-09-30 | Disposition: A | Discharge status: Home / Self Care | Source: Ambulatory Visit | Attending: Obstetrics and Gynecology | Admitting: Obstetrics and Gynecology

## 2020-09-30 ENCOUNTER — Other Ambulatory Visit: Payer: Self-pay

## 2020-09-30 DIAGNOSIS — N63 Unspecified lump in unspecified breast: Secondary | ICD-10-CM

## 2020-09-30 DIAGNOSIS — N644 Mastodynia: Secondary | ICD-10-CM

## 2021-02-09 LAB — HM PAP SMEAR: HM Pap smear: NEGATIVE

## 2021-02-17 ENCOUNTER — Other Ambulatory Visit: Payer: Self-pay

## 2021-02-17 ENCOUNTER — Ambulatory Visit (INDEPENDENT_AMBULATORY_CARE_PROVIDER_SITE_OTHER): Admitting: Family

## 2021-02-17 ENCOUNTER — Encounter: Payer: Self-pay | Admitting: Family

## 2021-02-17 ENCOUNTER — Telehealth: Payer: Self-pay | Admitting: Family

## 2021-02-17 VITALS — BP 95/67 | HR 82 | Temp 98.1°F | Resp 16 | Ht 66.1 in | Wt 155.0 lb

## 2021-02-17 DIAGNOSIS — F419 Anxiety disorder, unspecified: Secondary | ICD-10-CM | POA: Diagnosis not present

## 2021-02-17 DIAGNOSIS — F32A Depression, unspecified: Secondary | ICD-10-CM

## 2021-02-17 DIAGNOSIS — Z0000 Encounter for general adult medical examination without abnormal findings: Secondary | ICD-10-CM | POA: Insufficient documentation

## 2021-02-17 LAB — CBC WITH DIFFERENTIAL/PLATELET
Basophils Absolute: 0 10*3/uL (ref 0.0–0.1)
Basophils Relative: 0.5 % (ref 0.0–3.0)
Eosinophils Absolute: 0.1 10*3/uL (ref 0.0–0.7)
Eosinophils Relative: 1.5 % (ref 0.0–5.0)
HCT: 40.1 % (ref 36.0–46.0)
Hemoglobin: 13.4 g/dL (ref 12.0–15.0)
Lymphocytes Relative: 28.6 % (ref 12.0–46.0)
Lymphs Abs: 2 10*3/uL (ref 0.7–4.0)
MCHC: 33.4 g/dL (ref 30.0–36.0)
MCV: 91.3 fl (ref 78.0–100.0)
Monocytes Absolute: 0.5 10*3/uL (ref 0.1–1.0)
Monocytes Relative: 7 % (ref 3.0–12.0)
Neutro Abs: 4.3 10*3/uL (ref 1.4–7.7)
Neutrophils Relative %: 62.4 % (ref 43.0–77.0)
Platelets: 260 10*3/uL (ref 150.0–400.0)
RBC: 4.39 Mil/uL (ref 3.87–5.11)
RDW: 13 % (ref 11.5–15.5)
WBC: 7 10*3/uL (ref 4.0–10.5)

## 2021-02-17 LAB — COMPREHENSIVE METABOLIC PANEL
ALT: 9 U/L (ref 0–35)
AST: 11 U/L (ref 0–37)
Albumin: 4.6 g/dL (ref 3.5–5.2)
Alkaline Phosphatase: 48 U/L (ref 39–117)
BUN: 17 mg/dL (ref 6–23)
CO2: 26 mEq/L (ref 19–32)
Calcium: 9.5 mg/dL (ref 8.4–10.5)
Chloride: 102 mEq/L (ref 96–112)
Creatinine, Ser: 0.73 mg/dL (ref 0.40–1.20)
GFR: 106.46 mL/min (ref >60.00)
Glucose, Bld: 70 mg/dL (ref 70–99)
Potassium: 4.4 mEq/L (ref 3.5–5.1)
Sodium: 138 mEq/L (ref 135–145)
Total Bilirubin: 0.3 mg/dL (ref 0.2–1.2)
Total Protein: 7 g/dL (ref 6.0–8.3)

## 2021-02-17 LAB — LIPID PANEL
Cholesterol: 211 mg/dL — ABNORMAL HIGH (ref 0–200)
HDL: 72.8 mg/dL (ref >39.00)
LDL Cholesterol: 107 mg/dL — ABNORMAL HIGH (ref 0–99)
NonHDL: 138.27
Total CHOL/HDL Ratio: 3
Triglycerides: 157 mg/dL — ABNORMAL HIGH (ref 0.0–149.0)
VLDL: 31.4 mg/dL (ref 0.0–40.0)

## 2021-02-17 LAB — TSH: TSH: 3.12 u[IU]/mL (ref 0.35–5.50)

## 2021-02-17 MED ORDER — FLUOXETINE HCL 10 MG PO CAPS: 10.0000 mg | ORAL_CAPSULE | Freq: Every day | ORAL | 0 refills | Status: DC

## 2021-02-17 NOTE — Patient Instructions (Addendum)
Please schedule a routine eye exam. Try counting calories using My Fitness Pal with a goal of 1200-1500. Try to restart your exercise program with a goal of increasing to 5 days a week.  Please start prozac 10mg  once daily to help with anxiety and depression.

## 2021-02-17 NOTE — Assessment & Plan Note (Addendum)
Wt Readings from Last 3 Encounters:  02/17/21 155 lb (70.3 kg)  01/25/20 182 lb (82.6 kg)  12/23/18 199 lb 15.3 oz (90.7 kg)   She wants to lose 10 more pounds.  I encouraged her to try to increase her exercise frequency and keep calories to 1200-1500.

## 2021-02-17 NOTE — Assessment & Plan Note (Signed)
Uncontrolled.   Depression screen [facility] 2/9 02/17/2021  Decreased Interest 2  Down, Depressed, Hopeless 2  PHQ - 2 Score 4  Altered sleeping 1  Tired, decreased energy 2  Change in appetite 0  Feeling bad or failure about yourself  0  Trouble concentrating 1  Moving slowly or fidgety/restless 1  Suicidal thoughts 1  PHQ-9 Score 10   GAD 7 : Generalized Anxiety Score 02/17/2021  Nervous, Anxious, on Edge 1  Control/stop worrying 1  Worry too much - different things 2  Trouble relaxing 2  Restless 0  Easily annoyed or irritable 2  Afraid - awful might happen 1  Total GAD 7 Score 9    Will initiate prozac 10mg  once daily.  We discussed common side effects such as nausea, drowsiness and weight gain.  Also discussed rare but serious side effect of suicide ideation.  She is instructed to discontinue medication go directly to ED if this occurs.  Pt verbalizes understanding.  Plan follow up in 1 month to evaluate progress.

## 2021-02-17 NOTE — Telephone Encounter (Signed)
Records release sent to [facility] obgyn

## 2021-02-17 NOTE — Progress Notes (Signed)
Subjective:   By signing my name below, I, [name], attest that this documentation has been prepared under the direction and in the presence of Debbrah Alar NP. 02/17/2021    Patient ID: Ruth Mccarthy, female    DOB: [birth year], 35 y.o.   MRN: [id number]  Chief Complaint  Patient presents with   Annual Exam    HPI Patient is in today for a comprehensive physical exam. She denies having any unexpected weight change, hearing loss and rhinorrhea, visual disturbance, cough, chest pain and leg swelling, nausea, vomiting, diarrhea and blood in stool, or dysuria and frequency, for arthralgias, rash, headaches, adenopathy at this time. She does not drink alcohol. She does not use drugs. She does not use tobacco products. She has no recent surgical procedure. She has no recent change in her family medical history.  Breast pain- She reports having breast pain and getting a mammogram. She was told her results were normal and that her pain may be due to hormonal issues. She notes she does not use any birth control except condoms. She is willing to try birth control to manage her pain. She recently had her nexplanon removed.   Menstrual cycle- She reports her menstrual cycle is irregular. She expects it will normalize now that her nexplanon is removed.   Weight loss- She has lost 30 lb since starting her weight loss plan but is struggling with the last 10 lb of her goal. She is requesting for a program to help her lose weight.   Wt Readings from Last 3 Encounters:  02/17/21 155 lb (70.3 kg)  01/25/20 182 lb (82.6 kg)  12/23/18 199 lb 15.3 oz (90.7 kg)   Mood- She reports that she is having severe bouts of summer throughout the summer. She mentions while having these episodes she struggles to put her kids to bed or cook them breakfast.  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 She also reports that her husband working away from home has caused her stress to increase as  well.  She is willing to try anti-depressant medication to manage her mood. She is also willing to go to counseling and was given a referral from GYN office to a counseling service.  Immunizations: She is UTD on tetanus vaccines. She does not have the Covid-19 vaccines and is not interested in getting it because she has not been affected by Covid-19 so far. She occasionally gets a seasonal flu vaccine and might get it this year.  Diet: She is managing a healthy diet by doing a variation of a keto diet. She notes she has cut out carbohydrates from most of her diet. She does not count her calories.  Exercise: She used to participate in zumba 2x weekly but reports she does not participate in regular exercise since summer started.  Mammogram- She reports she recently completed a mammogram.  Pap Smear: Last completed 02/09/2021. Dental: She is UTD on dental care. Vision: She is due for vision care.   Health Maintenance Due  Topic Date Due   Hepatitis C Screening  Never done   PAP SMEAR-Modifier  03/01/2020   INFLUENZA VACCINE  02/06/2021    Past Medical History:  Diagnosis Date   Broken finger    No pertinent past medical history    Spontaneous vaginal delivery 12/23/2018   SVD (spontaneous vaginal delivery) 01/08/2014    Past Surgical History:  Procedure Laterality Date   EYE SURGERY     lasix  NO PAST SURGERIES     WISDOM TOOTH EXTRACTION      Family History  Problem Relation Age of Onset   Alcohol abuse Mother    Cancer Mother        cervix   Hypertension Mother    Alcohol abuse Maternal Uncle    Hyperlipidemia Maternal Grandmother    Vision loss Maternal Grandmother    Colon polyps Maternal Grandmother    Diabetes Maternal Grandfather    Hypertension Maternal Grandfather    Stroke Maternal Grandfather    Anesthesia problems Neg Hx     Social History   Socioeconomic History   Marital status: Married    Spouse name: Not on file   Number of children: 3   Years of  education: Not on file   Highest education level: Not on file  Occupational History   Occupation: Pharmacist, hospital  Tobacco Use   Smoking status: Never   Smokeless tobacco: Never  Vaping Use   Vaping Use: Never used  Substance and Sexual Activity   Alcohol use: No   Drug use: No   Sexual activity: Yes    Partners: Male  Other Topics Concern   Not on file  Social History Narrative   Daughter- 2013 Warren Lacy   [id number]   Daughter-2020 Minette Brine   Married   Works as a Licensed conveyancer   Completed bachelors   Dog ([place]- Pharmacologist)   Enjoys playing games on her phone   In-laws are local   Husband is in [employer]- deployed.    Social Determinants of Health   Financial Resource Strain: Not on file  Food Insecurity: Not on file  Transportation Needs: Not on file  Physical Activity: Not on file  Stress: Not on file  Social Connections: Not on file  Intimate Partner Violence: Not on file    Outpatient Medications Prior to Visit  Medication Sig Dispense Refill   ibuprofen (ADVIL) 600 MG tablet Take 1 tablet (600 mg total) by mouth every 6 (six) hours. 30 tablet 0   Multiple Vitamins-Calcium (ONE-A-DAY WOMENS FORMULA) TABS Take by mouth.     No facility-administered medications prior to visit.    Allergies  Allergen Reactions   Kiwi Extract Itching   Shrimp [Shellfish Allergy] Diarrhea and Nausea And Vomiting    Review of Systems  Constitutional:        (-)unexpected weight change (-)Adenopathy  HENT:  Negative for hearing loss.        (-)Rhinorrhea   Eyes:        (-)Visual disturbance  Respiratory:  Negative for cough.   Cardiovascular:  Negative for chest pain and leg swelling.  Gastrointestinal:  Negative for blood in stool, constipation, diarrhea, nausea and vomiting.  Genitourinary:  Negative for dysuria and frequency.  Musculoskeletal:  Positive for myalgias (breast pain). Negative for joint pain.  Skin:  Negative for rash.  Neurological:  Negative for  headaches.  Psychiatric/Behavioral:  Positive for depression. The patient is nervous/anxious.       Objective:    Physical Exam Constitutional:      General: She is not in acute distress.    Appearance: Normal appearance. She is not ill-appearing.  HENT:     Head: Normocephalic and atraumatic.     Right Ear: External ear normal.     Left Ear: External ear normal.  Eyes:     Extraocular Movements: Extraocular movements intact.     Pupils: Pupils are equal, round, and reactive to light.  Comments: No nystagmus   Cardiovascular:     Rate and Rhythm: Normal rate and regular rhythm.     Heart sounds: Normal heart sounds. No murmur heard.   No gallop.  Pulmonary:     Effort: Pulmonary effort is normal. No respiratory distress.     Breath sounds: Normal breath sounds. No wheezing or rales.  Abdominal:     General: Bowel sounds are normal. There is no distension.     Palpations: Abdomen is soft.     Tenderness: There is no abdominal tenderness. There is no guarding.  Musculoskeletal:     Comments: 5/5 strength in both upper and lower extremities  Skin:    General: Skin is warm and dry.  Neurological:     Mental Status: She is alert and oriented to person, place, and time.     Deep Tendon Reflexes:     Reflex Scores:      Patellar reflexes are 2+ on the right side and 2+ on the left side. Psychiatric:        Mood and Affect: Affect is flat and tearful.        Behavior: Behavior normal.        Judgment: Judgment normal.    BP 95/67 (BP Location: Right Arm, Patient Position: Sitting, Cuff Size: Small)   Pulse 82   Temp 98.1 F (36.7 C) (Oral)   Resp 16   Ht 5' 6.1" (1.679 m)   Wt 155 lb (70.3 kg)   SpO2 100%   BMI 24.94 kg/m  Wt Readings from Last 3 Encounters:  02/17/21 155 lb (70.3 kg)  01/25/20 182 lb (82.6 kg)  12/23/18 199 lb 15.3 oz (90.7 kg)       Assessment & Plan:   Problem List Items Addressed This Visit       Unprioritized   Preventative health  care - Primary    Wt Readings from Last 3 Encounters:  02/17/21 155 lb (70.3 kg)  01/25/20 182 lb (82.6 kg)  12/23/18 199 lb 15.3 oz (90.7 kg)  She wants to lose 10 more pounds.  I encouraged her to try to increase her exercise frequency and keep calories to 1200-1500.      Relevant Orders   Comp Met (CMET)   CBC with Differential/Platelet   Lipid panel   TSH   Anxiety and depression    Uncontrolled.   Depression screen [facility] 2/9 02/17/2021  Decreased Interest 2  Down, Depressed, Hopeless 2  PHQ - 2 Score 4  Altered sleeping 1  Tired, decreased energy 2  Change in appetite 0  Feeling bad or failure about yourself  0  Trouble concentrating 1  Moving slowly or fidgety/restless 1  Suicidal thoughts 1  PHQ-9 Score 10   GAD 7 : Generalized Anxiety Score 02/17/2021  Nervous, Anxious, on Edge 1  Control/stop worrying 1  Worry too much - different things 2  Trouble relaxing 2  Restless 0  Easily annoyed or irritable 2  Afraid - awful might happen 1  Total GAD 7 Score 9   Will initiate prozac 29m once daily.  We discussed common side effects such as nausea, drowsiness and weight gain.  Also discussed rare but serious side effect of suicide ideation.  She is instructed to discontinue medication go directly to ED if this occurs.  Pt verbalizes understanding.  Plan follow up in 1 month to evaluate progress.          Relevant Medications   FLUoxetine (PROZAC)  10 MG capsule     Meds ordered this encounter  Medications   FLUoxetine (PROZAC) 10 MG capsule    Sig: Take 1 capsule (10 mg total) by mouth daily.    Dispense:  30 capsule    Refill:  0    Order Specific Question:   Supervising Provider    Answer:   Penni Homans A [[id number]]    I, Debbrah Alar NP, personally preformed the services described in this documentation.  All medical record entries made by the scribe were at my direction and in my presence.  I have reviewed the chart and discharge instructions (if  applicable) and agree that the record reflects my personal performance and is accurate and complete. 02/17/2021   I,[name],acting as a Education administrator for Nance Pear, NP.,have documented all relevant documentation on the behalf of Nance Pear, NP,as directed by  Nance Pear, NP while in the presence of Nance Pear, NP.   Nance Pear, NP

## 2021-02-17 NOTE — Telephone Encounter (Signed)
Please call [facility] and request pap report.

## 2021-02-21 ENCOUNTER — Encounter: Payer: Self-pay | Admitting: Family

## 2021-03-14 ENCOUNTER — Other Ambulatory Visit: Payer: Self-pay

## 2021-03-14 ENCOUNTER — Ambulatory Visit (INDEPENDENT_AMBULATORY_CARE_PROVIDER_SITE_OTHER): Admitting: Family

## 2021-03-14 VITALS — BP 96/73 | HR 73 | Temp 98.6°F | Resp 16 | Wt 156.0 lb

## 2021-03-14 DIAGNOSIS — F32A Depression, unspecified: Secondary | ICD-10-CM | POA: Diagnosis not present

## 2021-03-14 DIAGNOSIS — M5432 Sciatica, left side: Secondary | ICD-10-CM

## 2021-03-14 DIAGNOSIS — N912 Amenorrhea, unspecified: Secondary | ICD-10-CM | POA: Diagnosis not present

## 2021-03-14 DIAGNOSIS — F419 Anxiety disorder, unspecified: Secondary | ICD-10-CM

## 2021-03-14 DIAGNOSIS — Z23 Encounter for immunization: Secondary | ICD-10-CM

## 2021-03-14 HISTORY — DX: Sciatica, left side: M54.32

## 2021-03-14 LAB — POCT URINE PREGNANCY: Preg Test, Ur: NEGATIVE

## 2021-03-14 MED ORDER — MELOXICAM 7.5 MG PO TABS: 7.5000 mg | ORAL_TABLET | Freq: Every day | ORAL | 0 refills | Status: DC

## 2021-03-14 MED ORDER — FLUOXETINE HCL 20 MG PO CAPS
20.0000 mg | ORAL_CAPSULE | Freq: Every day | ORAL | 2 refills | Status: DC
Start: 2021-03-14 — End: 2021-05-19

## 2021-03-14 NOTE — Assessment & Plan Note (Signed)
New. Improving, but not resolved. Will plan short course of meloxicam 7.5mg  once daily.

## 2021-03-14 NOTE — Assessment & Plan Note (Addendum)
Discussed with pt that it can take several months before she begins to have regular menstrual cycles. Urine HCG is negative.

## 2021-03-14 NOTE — Progress Notes (Signed)
Subjective:     Patient ID: Ruth Mccarthy, female    DOB: 09-30-1985, 35 y.o.   MRN: [id number]  Chief Complaint  Patient presents with   Depression    Here for follow up   Anxiety    Here for follow up    HPI Patient is in today for follow up. She noted last visit that she was having low mood and struggling to get through her daily responsibilities.  She reports that she is still anxious, now back to work.  She is not as sad.  Less tearful.  Being back in Mccarthy routine is helpful as well. She had HA x 1 week. She has been tired.  In the AM she feels "really tired again." She is getting 7-8 hrs Mccarthy night.    Wt Readings from Last 3 Encounters:  03/14/21 156 lb (70.8 kg)  02/17/21 155 lb (70.3 kg)  01/25/20 182 lb (82.6 kg)   Reports that she has had some low back pain x 3 days.  She had one episode of spotting last week. She had nexplanon removed August 4th.  She has done several pregnancy tests which have all been negative. She is using condoms for birth control.    Health Maintenance Due  Topic Date Due   COVID-19 Vaccine (1) Never done   Hepatitis C Screening  Never done    Past Medical History:  Diagnosis Date   Broken finger    No pertinent past medical history    Spontaneous vaginal delivery 12/23/2018   SVD (spontaneous vaginal delivery) 01/08/2014    Past Surgical History:  Procedure Laterality Date   EYE SURGERY     lasix   NO PAST SURGERIES     WISDOM TOOTH EXTRACTION      Family History  Problem Relation Age of Onset   Alcohol abuse Mother    Cancer Mother        cervix   Hypertension Mother    Alcohol abuse Maternal Uncle    Hyperlipidemia Maternal Grandmother    Vision loss Maternal Grandmother    Colon polyps Maternal Grandmother    Diabetes Maternal Grandfather    Hypertension Maternal Grandfather    Stroke Maternal Grandfather    Anesthesia problems Neg Hx     Social History   Socioeconomic History   Marital status: Married    Spouse name:  Not on file   Number of children: 3   Years of education: Not on file   Highest education level: Not on file  Occupational History   Occupation: Runner, broadcasting/film/video  Tobacco Use   Smoking status: Never   Smokeless tobacco: Never  Vaping Use   Vaping Use: Never used  Substance and Sexual Activity   Alcohol use: No   Drug use: No   Sexual activity: Yes    Partners: Male  Other Topics Concern   Not on file  Social History Narrative   Daughter- 2013 Ruth Mccarthy   [id number] Ruth Mccarthy   Daughter-2020 Ruth Mccarthy   Married   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    Social Determinants of Health   Financial Resource Strain: Not on file  Food Insecurity: Not on file  Transportation Needs: Not on file  Physical Activity: Not on file  Stress: Not on file  Social Connections: Not on file  Intimate Partner Violence: Not on file    Outpatient Medications Prior to Visit  Medication Sig Dispense Refill   Multiple Vitamins-Calcium (ONE-Mccarthy-DAY WOMENS FORMULA) TABS Take by mouth.     FLUoxetine (PROZAC) 10 MG capsule Take 1 capsule (10 mg total) by mouth daily. 30 capsule 0   ibuprofen (ADVIL) 600 MG tablet Take 1 tablet (600 mg total) by mouth every 6 (six) hours. 30 tablet 0   No facility-administered medications prior to visit.    Allergies  Allergen Reactions   Kiwi Extract Itching   Shrimp [Shellfish Allergy] Diarrhea and Nausea And Vomiting    ROS    See HPI Objective:    Physical Exam Constitutional:      Appearance: Normal appearance.  HENT:     Head: Normocephalic and atraumatic.  Pulmonary:     Effort: Pulmonary effort is normal.  Musculoskeletal:     Comments: Left lower lumbar tenderness to palpation  Neurological:     Mental Status: She is alert and oriented to person, place, and time.  Psychiatric:        Attention and Perception: Attention  normal.        Mood and Affect: Mood normal.        Speech: Speech normal.        Behavior: Behavior normal.    BP 96/73 (BP Location: Right Arm, Patient Position: Sitting, Cuff Size: Small)   Pulse 73   Temp 98.6 F (37 C) (Oral)   Resp 16   Wt 156 lb (70.8 kg)   SpO2 99%   BMI 25.10 kg/m  Wt Readings from Last 3 Encounters:  03/14/21 156 lb (70.8 kg)  02/17/21 155 lb (70.3 kg)  01/25/20 182 lb (82.6 kg)       Assessment & Plan:   Problem List Items Addressed This Visit       Unprioritized   Sciatica, left side    New. Improving, but not resolved. Will plan short course of meloxicam 7.5mg  once daily.        Relevant Medications   FLUoxetine (PROZAC) 20 MG capsule   Anxiety and depression    Some improvement but not optimized.  Will increase prozac to 20mg . She is advised to let me know if she has increased sleepiness on higher dose of prozac.      Relevant Medications   FLUoxetine (PROZAC) 20 MG capsule   Amenorrhea - Primary    Discussed with pt that it can take several months before she begins to have regular menstrual cycles. Urine HCG is negative.       Relevant Orders   POCT urine pregnancy (Completed)   Other Visit Diagnoses     Needs flu shot       Relevant Orders   Flu Vaccine QUAD 6+ mos PF IM (Fluarix Quad PF) (Completed)       I have discontinued Ruth Mccarthy's ibuprofen and FLUoxetine. I am also having her start on FLUoxetine and meloxicam. Additionally, I am having her maintain her One-Mccarthy-Day .  Meds ordered this encounter  Medications   FLUoxetine (PROZAC) 20 MG capsule    Sig: Take 1 capsule (20 mg total) by mouth daily.    Dispense:  30 capsule    Refill:  2    Order Specific Question:   Supervising Provider    Answer:   Ruth Mccarthy [[id number]]   meloxicam (MOBIC) 7.5 MG tablet    Sig: Take 1 tablet (7.5 mg total) by mouth  daily.    Dispense:  14 tablet    Refill:  0    Order Specific Question:   Supervising  Provider    Answer:   Ruth Mccarthy [[id number]]

## 2021-03-14 NOTE — Assessment & Plan Note (Signed)
Some improvement but not optimized.  Will increase prozac to 20mg . She is advised to let me know if she has increased sleepiness on higher dose of prozac.

## 2021-04-04 ENCOUNTER — Ambulatory Visit: Admitting: Psychologist

## 2021-04-04 ENCOUNTER — Ambulatory Visit
Admission: RE | Admit: 2021-04-04 | Discharge: 2021-04-04 | Disposition: A | Discharge status: Home / Self Care | Source: Ambulatory Visit | Attending: Obstetrics and Gynecology | Admitting: Obstetrics and Gynecology

## 2021-04-04 ENCOUNTER — Other Ambulatory Visit: Payer: Self-pay | Admitting: Obstetrics and Gynecology

## 2021-04-04 ENCOUNTER — Other Ambulatory Visit: Payer: Self-pay

## 2021-04-04 DIAGNOSIS — N644 Mastodynia: Secondary | ICD-10-CM

## 2021-04-04 DIAGNOSIS — N63 Unspecified lump in unspecified breast: Secondary | ICD-10-CM

## 2021-04-05 ENCOUNTER — Ambulatory Visit (INDEPENDENT_AMBULATORY_CARE_PROVIDER_SITE_OTHER): Admitting: Family Medicine

## 2021-04-05 ENCOUNTER — Encounter: Payer: Self-pay | Admitting: Family

## 2021-04-05 VITALS — BP 100/72 | HR 66 | Temp 97.6°F | Resp 18 | Ht 66.0 in | Wt 159.4 lb

## 2021-04-05 DIAGNOSIS — H00011 Hordeolum externum right upper eyelid: Secondary | ICD-10-CM

## 2021-04-05 NOTE — Patient Instructions (Signed)
You have a stye of the right lid- these can be painful and annoying!  Use hot compresses frequently over the next couple of days until it drains. If not doing away please let me know- sooner if any changes or vision problems Moist heat is often better- like a very hot cloth

## 2021-04-05 NOTE — Progress Notes (Signed)
[facility] [address] [place], [place] [address] [phone]  Date:  04/05/2021   Name:  Ruth Mccarthy   DOB:  1985-11-11   MRN:  [id number]  PCP:  Sandford Craze, NP    Chief Complaint: Belepharitis (Right side X Saturday, pain, red, swelling. Eye feels itchy and dry. //)   History of Present Illness:  Ruth Mccarthy is a 35 y.o. very pleasant female patient who presents with the following:  Pt seen today with concern of eyelid issue  Today is Wednesday On Saturday she noted some soreness of her right upper lid Yesterday it hurt more. She tried a hearing pad but seemed to get worse This am she had some tearing from the eye and mild crusting this am  Vision is ok but she has to blink out some matter at times She does not wear contacts  She is otherwise feeling ok   Patient Active Problem List   Diagnosis Date Noted   Sciatica, left side 03/14/2021   Amenorrhea 03/14/2021   Preventative health care 02/17/2021   Anxiety and depression 02/17/2021   Spontaneous vaginal delivery 12/23/2018    Past Medical History:  Diagnosis Date   Broken finger    No pertinent past medical history    Spontaneous vaginal delivery 12/23/2018   SVD (spontaneous vaginal delivery) 01/08/2014    Past Surgical History:  Procedure Laterality Date   EYE SURGERY     lasix   NO PAST SURGERIES     WISDOM TOOTH EXTRACTION      Social History   Tobacco Use   Smoking status: Never   Smokeless tobacco: Never  Vaping Use   Vaping Use: Never used  Substance Use Topics   Alcohol use: No   Drug use: No    Family History  Problem Relation Age of Onset   Alcohol abuse Mother    Cancer Mother        cervix   Hypertension Mother    Alcohol abuse Maternal Uncle    Hyperlipidemia Maternal Grandmother    Vision loss Maternal Grandmother    Colon polyps Maternal Grandmother    Diabetes Maternal Grandfather     Hypertension Maternal Grandfather    Stroke Maternal Grandfather    Anesthesia problems Neg Hx     Allergies  Allergen Reactions   Kiwi Extract Itching   Shrimp [Shellfish Allergy] Diarrhea and Nausea And Vomiting    Medication list has been reviewed and updated.  Current Outpatient Medications on File Prior to Visit  Medication Sig Dispense Refill   FLUoxetine (PROZAC) 20 MG capsule Take 1 capsule (20 mg total) by mouth daily. 30 capsule 2   Multiple Vitamins-Calcium (ONE-A-DAY WOMENS FORMULA) TABS Take by mouth.     No current facility-administered medications on file prior to visit.    Review of Systems:  As per HPI- otherwise negative.   Physical Examination: Vitals:   04/05/21 1550  BP: 100/72  Pulse: 66  Resp: 18  Temp: 97.6 F (36.4 C)  SpO2: 98%   Vitals:   04/05/21 1550  Weight: 159 lb 6.4 oz (72.3 kg)  Height: 5\' 6"  (1.676 m)   Body mass index is 25.73 kg/m. Ideal Body Weight: Weight in (lb) to have BMI = 25: 154.6  GEN: No acute distress; alert,appropriate. PULM: Breathing comfortably in no respiratory distress PSYCH: Normally interactive.  The right upper lid is erythematous and slightly swollen c/w  hordeolum.  Conjunctivae normal, limited fundoscopic exam wnl PEERL, EOMI  Assessment and Plan: Hordeolum externum of right upper eyelid Pt has a stye of the right upper lid Encouraged frequent hot compresses with moist heat until it drains Asked her to please contact me if not drained in the next couple of days- sooner if any concerns or if worsening   Signed Abbe Amsterdam, MD

## 2021-04-19 ENCOUNTER — Ambulatory Visit (INDEPENDENT_AMBULATORY_CARE_PROVIDER_SITE_OTHER): Admitting: Psychologist

## 2021-04-19 ENCOUNTER — Ambulatory Visit: Admitting: Psychologist

## 2021-04-19 DIAGNOSIS — F411 Generalized anxiety disorder: Secondary | ICD-10-CM

## 2021-04-19 DIAGNOSIS — F320 Major depressive disorder, single episode, mild: Secondary | ICD-10-CM

## 2021-04-25 ENCOUNTER — Other Ambulatory Visit: Payer: Self-pay

## 2021-04-25 ENCOUNTER — Telehealth (INDEPENDENT_AMBULATORY_CARE_PROVIDER_SITE_OTHER): Admitting: Family

## 2021-04-25 DIAGNOSIS — N912 Amenorrhea, unspecified: Secondary | ICD-10-CM

## 2021-04-25 DIAGNOSIS — F419 Anxiety disorder, unspecified: Secondary | ICD-10-CM | POA: Diagnosis not present

## 2021-04-25 DIAGNOSIS — M5432 Sciatica, left side: Secondary | ICD-10-CM

## 2021-04-25 DIAGNOSIS — F32A Depression, unspecified: Secondary | ICD-10-CM | POA: Diagnosis not present

## 2021-04-25 NOTE — Assessment & Plan Note (Signed)
Much improved.  Monitor.  °

## 2021-04-25 NOTE — Progress Notes (Signed)
MyChart Video Visit    Virtual Visit via Video Note   This visit type was conducted due to national recommendations for restrictions regarding the COVID-19 Pandemic (e.g. social distancing) in an effort to limit this patient's exposure and mitigate transmission in our community. This patient is at least at moderate risk for complications without adequate follow up. This format is felt to be most appropriate for this patient at this time. Physical exam was limited by quality of the video and audio technology used for the visit. Windell Moulding was able to get the patient set up on a video visit.  Patient location:Home Patient and provider in visit Provider location: Office  I discussed the limitations of evaluation and management by telemedicine and the availability of in person appointments. The patient expressed understanding and agreed to proceed.  Visit Date: 04/25/2021  Today's healthcare provider: Lemont Fillers, NP     Subjective:    Patient ID: Ruth Mccarthy, female    DOB: 1986-05-19, 35 y.o.   MRN: [id number]  Chief Complaint  Patient presents with   Anxiety    Patient reports doing better with medication increase   Depression    Patient reports doing better    HPI Patient is in today for a video visit.  Depression: She recently went back to work and feels like this has improved her productivity and desire to complete day to day things. During her last visit her 10 mg Prozac was increased to 20 mg Prozac and she notes improvement with this adjustment. She describes this improvement as she is still feeling sad but no longer feeling hopeless. She has also seen a counselor since her last visit and notes that it was not bad nor was it good. She has another appointment set up with the counselor for later in the year.  Menstrual cycle: Since her last visit she has had a menstrual cycle. She remarks that she felt out of control of her emotions during her period and felt very  down but once her period finished up, she began feeling better.  Leg pain: She came in with leg pain during her last visit that was ruled as sciatica pain. She notes improvement of this pain but mentions that she has recently joined a bowling league which sometimes leaves her with this pain but is resolved with the use of a heating pad.   Past Medical History:  Diagnosis Date   Broken finger    No pertinent past medical history    Spontaneous vaginal delivery 12/23/2018   SVD (spontaneous vaginal delivery) 01/08/2014    Past Surgical History:  Procedure Laterality Date   EYE SURGERY     lasix   NO PAST SURGERIES     WISDOM TOOTH EXTRACTION      Family History  Problem Relation Age of Onset   Alcohol abuse Mother    Cancer Mother        cervix   Hypertension Mother    Alcohol abuse Maternal Uncle    Hyperlipidemia Maternal Grandmother    Vision loss Maternal Grandmother    Colon polyps Maternal Grandmother    Diabetes Maternal Grandfather    Hypertension Maternal Grandfather    Stroke Maternal Grandfather    Anesthesia problems Neg Hx     Social History   Socioeconomic History   Marital status: Married    Spouse name: Not on file   Number of children: 3   Years of education: Not on file  Highest education level: Not on file  Occupational History   Occupation: teacher  Tobacco Use   Smoking status: Never   Smokeless tobacco: Never  Vaping Use   Vaping Use: Never used  Substance and Sexual Activity   Alcohol use: No   Drug use: No   Sexual activity: Yes    Partners: Male  Other Topics Concern   Not on file  Social History Narrative   Daughter- 2013 Shea Evans   [id number]   Daughter-2020 Zollie Scale   Married   Works as a Electrical engineer   Completed bachelors   Dog (Morkie- Cytogeneticist)   Enjoys playing games on her phone   In-laws are local   Husband is in [employer]- deployed.    Social Determinants of Health   Financial Resource Strain: Not on  file  Food Insecurity: Not on file  Transportation Needs: Not on file  Physical Activity: Not on file  Stress: Not on file  Social Connections: Not on file  Intimate Partner Violence: Not on file    Outpatient Medications Prior to Visit  Medication Sig Dispense Refill   FLUoxetine (PROZAC) 20 MG capsule Take 1 capsule (20 mg total) by mouth daily. 30 capsule 2   Multiple Vitamins-Calcium (ONE-A-DAY WOMENS FORMULA) TABS Take by mouth.     No facility-administered medications prior to visit.    Allergies  Allergen Reactions   Kiwi Extract Itching   Shrimp [Shellfish Allergy] Diarrhea and Nausea And Vomiting    Review of Systems  Genitourinary:  Flank pain: Improved since last visit.  Musculoskeletal:        (+) leg pain that has resolved but returns with exercise  Psychiatric/Behavioral:  Positive for depression. The patient is nervous/anxious (Improved since last visit).       Objective:    Physical Exam Constitutional:      General: She is not in acute distress.    Appearance: Normal appearance. She is not ill-appearing.  HENT:     Head: Normocephalic and atraumatic.     Right Ear: External ear normal.     Left Ear: External ear normal.  Neurological:     Mental Status: She is alert.  Psychiatric:        Behavior: Behavior normal.        Judgment: Judgment normal.    There were no vitals taken for this visit. Wt Readings from Last 3 Encounters:  04/05/21 159 lb 6.4 oz (72.3 kg)  03/14/21 156 lb (70.8 kg)  02/17/21 155 lb (70.3 kg)    Diabetic Foot Exam - Simple   No data filed    Lab Results  Component Value Date   WBC 7.0 02/17/2021   HGB 13.4 02/17/2021   HCT 40.1 02/17/2021   PLT 260.0 02/17/2021   GLUCOSE 70 02/17/2021   CHOL 211 (H) 02/17/2021   TRIG 157.0 (H) 02/17/2021   HDL 72.80 02/17/2021   LDLCALC 107 (H) 02/17/2021   ALT 9 02/17/2021   AST 11 02/17/2021   NA 138 02/17/2021   K 4.4 02/17/2021   CL 102 02/17/2021   CREATININE 0.73  02/17/2021   BUN 17 02/17/2021   CO2 26 02/17/2021   TSH 3.12 02/17/2021    Lab Results  Component Value Date   TSH 3.12 02/17/2021   Lab Results  Component Value Date   WBC 7.0 02/17/2021   HGB 13.4 02/17/2021   HCT 40.1 02/17/2021   MCV 91.3 02/17/2021   PLT 260.0 02/17/2021  Lab Results  Component Value Date   NA 138 02/17/2021   K 4.4 02/17/2021   CO2 26 02/17/2021   GLUCOSE 70 02/17/2021   BUN 17 02/17/2021   CREATININE 0.73 02/17/2021   BILITOT 0.3 02/17/2021   ALKPHOS 48 02/17/2021   AST 11 02/17/2021   ALT 9 02/17/2021   PROT 7.0 02/17/2021   ALBUMIN 4.6 02/17/2021   CALCIUM 9.5 02/17/2021   GFR 106.46 02/17/2021   Lab Results  Component Value Date   CHOL 211 (H) 02/17/2021   Lab Results  Component Value Date   HDL 72.80 02/17/2021   Lab Results  Component Value Date   LDLCALC 107 (H) 02/17/2021   Lab Results  Component Value Date   TRIG 157.0 (H) 02/17/2021   Lab Results  Component Value Date   CHOLHDL 3 02/17/2021   No results found for: HGBA1C     Assessment & Plan:   Problem List Items Addressed This Visit       Unprioritized   Sciatica, left side    Much improved. Monitor.       Anxiety and depression    Improved on increased dose of prozac (20mg ). Continue current dose and I also encouraged her to continue her work with the counselor.       Amenorrhea    Patient did finally get her period.       No orders of the defined types were placed in this encounter.   I discussed the assessment and treatment plan with the patient. The patient was provided an opportunity to ask questions and all were answered. The patient agreed with the plan and demonstrated an understanding of the instructions.   The patient was advised to call back or seek an in-person evaluation if the symptoms worsen or if the condition fails to improve as anticipated.  I,Lyric Barr-McArthur,acting as a for Neurosurgeon, NP.,have documented  all relevant documentation on the behalf of [employer], NP,as directed by  Lemont Fillers, NP while in the presence of Lemont Fillers, NP.  I provided 20 minutes of face-to-face time during this encounter.   Lemont Fillers, NP Lemont Fillers at [employer] [phone] (phone) [phone] (fax)  [facility]

## 2021-04-25 NOTE — Assessment & Plan Note (Signed)
Improved on increased dose of prozac (20mg ). Continue current dose and I also encouraged her to continue her work with the counselor.

## 2021-04-25 NOTE — Assessment & Plan Note (Signed)
Patient did finally get her period.

## 2021-05-17 ENCOUNTER — Ambulatory Visit (INDEPENDENT_AMBULATORY_CARE_PROVIDER_SITE_OTHER): Admitting: Psychologist

## 2021-05-17 DIAGNOSIS — F411 Generalized anxiety disorder: Secondary | ICD-10-CM | POA: Diagnosis not present

## 2021-05-17 DIAGNOSIS — F320 Major depressive disorder, single episode, mild: Secondary | ICD-10-CM | POA: Diagnosis not present

## 2021-05-19 ENCOUNTER — Other Ambulatory Visit: Payer: Self-pay | Admitting: Family

## 2021-06-14 ENCOUNTER — Ambulatory Visit (INDEPENDENT_AMBULATORY_CARE_PROVIDER_SITE_OTHER): Admitting: Psychologist

## 2021-06-14 DIAGNOSIS — F411 Generalized anxiety disorder: Secondary | ICD-10-CM | POA: Diagnosis not present

## 2021-06-14 DIAGNOSIS — F320 Major depressive disorder, single episode, mild: Secondary | ICD-10-CM

## 2021-06-14 NOTE — Progress Notes (Signed)
[facility] Counselor/Therapist Progress Note  Patient ID: Ruth Mccarthy, MRN: [id number],    Date: 06/14/2021  Time Spent: 3:04 pm to 3:45 pm; Total Time: 41 minutes   This session was held via video webex teletherapy due to the coronavirus risk at this time. The patient consented to video teletherapy and was located at her home during this session. She is aware it is the responsibility of the patient to secure confidentiality on her end of the session. The provider was in a private home office for the duration of this session. Limits of confidentiality were discussed with the patient.   Treatment Type: Individual Therapy  Reported Symptoms: Patient endorsed experiencing some depressive symptoms.   Mental Status Exam: Appearance:  Well Groomed     Behavior: Appropriate  Motor: Normal  Speech/Language:   Normal Rate  Affect: Appropriate  Mood: normal  Thought process: normal  Thought content:   WNL  Sensory/Perceptual disturbances:   WNL  Orientation: oriented to person, place, time/date, situation, and day of week  Attention: Good  Concentration: Good  Memory: WNL  Fund of knowledge:  Good  Insight:   Fair  Judgment:  Fair  Impulse Control: Good   Risk Assessment: Danger to Self:  No Self-injurious Behavior: No Danger to Others: No Duty to Warn:no Physical Aggression / Violence:No  Access to Firearms a concern: No  Gang Involvement:No   Subjective: Patient indicated that she is doing okay. She reflected on Thanksgiving and talked about Christmas. She acknowledged that she is looking forward to Christmas break. From there, she primarily spent the session reflecting on taking time for self. She processed her thoughts and emotions related to taking time for herself. She identified different barriers that make it difficult to take time for herself. She explored and processed different ways to take time. She was agreeable to the homework. She asked to follow up.  She denied suicidal and homicidal ideation.   Interventions: Worked on developing a therapeutic relationship with the patient using active listening and reflective statements. Provided emotional support using empathy and validation. Reviewed the treatment plan with the patient. Used summary statements. Normalized and validated expressed thoughts and emotions. Processed events that occurred since last session. Praised patient for doing okay. Explored how patient was feeling about Christmas break. Identified several themes related to self-care. Used socratic questions to assist the patient gain insight into self. Challenged some of the thoughts expressed. Used metaphors to assist the patient. Validated patient's experience. Processed steps to implement self-care. Used behavioral activation. Provided psychoeducation about a podcast episode for the patient to listen to so that patient can find a balance for self-care. Processed emotions related to implementing self-care. Provided empathic statements. Assigned homework. Assessed for suicidal and homicidal ideation.   Homework: Listen to podcast. Review handouts for behavioral activation/self-care activities  Next Session: Review homework and emotional support.  Long Term Goal: Patient will experience a decrease in severity of depressive symptoms on a daily basis from seven days a week to five days a week.  Short Term Goal: Patient will identify 3-4 behavioral activities she can implement for self-care.   Diagnosis: F32.0 major depressive affective disorder, single episode, mild and F41.1 generalized anxiety disorder  Plan: Patient will follow up in the middle of January. Patient's treatment plan is located in therapycharts. Patient is agreeable to treatment plan found in therapycharts.   Hilbert Corrigan, PsyD

## 2021-07-19 ENCOUNTER — Ambulatory Visit (INDEPENDENT_AMBULATORY_CARE_PROVIDER_SITE_OTHER): Admitting: Psychologist

## 2021-07-19 DIAGNOSIS — F320 Major depressive disorder, single episode, mild: Secondary | ICD-10-CM

## 2021-07-19 DIAGNOSIS — F411 Generalized anxiety disorder: Secondary | ICD-10-CM | POA: Diagnosis not present

## 2021-07-19 NOTE — Progress Notes (Signed)
[facility] Counselor/Therapist Progress Note  Patient ID: Ruth Mccarthy, MRN: [id number],    Date: 07/19/2021  Time Spent: 3:02 pm to 3:41 pm; Total Time: 39 minutes   This session was held via video webex teletherapy due to the coronavirus risk at this time. The patient consented to video teletherapy and was located at her home during this session. She is aware it is the responsibility of the patient to secure confidentiality on her end of the session. The provider was in a private home office for the duration of this session. Limits of confidentiality were discussed with the patient.   Treatment Type: Individual Therapy  Reported Symptoms: Patient endorsed experiencing anxiety symptoms   Mental Status Exam: Appearance:  Well Groomed     Behavior: Appropriate  Motor: Normal  Speech/Language:   Normal Rate  Affect: Appropriate  Mood: normal  Thought process: normal  Thought content:   WNL  Sensory/Perceptual disturbances:   WNL  Orientation: oriented to person, place, time/date, situation, and day of week  Attention: Good  Concentration: Good  Memory: WNL  Fund of knowledge:  Good  Insight:   Fair  Judgment:  Fair  Impulse Control: Good   Risk Assessment: Danger to Self:  No Self-injurious Behavior: No Danger to Others: No Duty to Warn:no Physical Aggression / Violence:No  Access to Firearms a concern: No  Gang Involvement:No   Subjective: Patient indicated that she is fair and began the session by indicating that she had a good holiday season. Patient had not completed her homework assignment from the previous session. From there, she voiced that she is experiencing more anxiety as her husband is now working in [employer]. Elaborating, patient stated that she is attempting to control her thoughts, however is experiencing more distress. Patient spent the session exploring the pros and cons of control, function of control, and defusion. After practicing defusion, the  patient described herself as doing better. She was open to discussing mindfulness at the next session. She was agreeable to the homework. She asked to follow up. She denied suicidal and homicidal ideation.   Interventions: Worked on developing a therapeutic relationship with the patient using active listening and reflective statements. Provided emotional support using empathy and validation. Reviewed the treatment plan with the patient. Reviewed events since the last session. Used summary statements. Praised patient for having a good holiday season. Normalized the worries patient expressed. Used socratic questions to assist the patient gain insight into self. Challenged some of the thoughts expressed. Explored the pros and cons of control. Used metaphors to assist the patient gain insight into control. Specifically used the [employer] while discussing the theme of control. Provided psychoeducation about control and functionality. Normalized expressed thoughts. Provided psychoeducation about defusion. Practiced and processed defusion. Praised patient for experiencing less distress. Assisted in problem solving. Briefly introduced the idea of mindfulness. Provided empathic statements. Assigned homework. Assessed for suicidal and homicidal ideation.   Homework: Complete worksheet 2.3 and practice defusion.   Next Session: Review homework and emotional support.  Diagnosis: F32.0 major depressive affective disorder, single episode, mild and F41.1 generalized anxiety disorder  Plan:   Client Abilities: Friendly and easy to develop rapport  Client Preferences: ACT and CBT  Client statement of Needs: Coping skills and process emotions  Treatment Level: Outpatient  Goals Alleviate depressive symptoms Recognize, accept, and cope with depressive feelings Develop healthy thinking patterns Develop healthy interpersonal relationships Reduce overall frequency, intensity, and duration of  anxiety Stabilize anxiety level wile  increasing ability to function Enhance ability to effectively cope with full variety of stressors Learn and implement coping skills that result in a reduction of anxiety   Objectives target date for all objectives is 04/19/2022 Verbalize an understanding of the cognitive, physiological, and behavioral components of anxiety Learning and implement calming skills to reduce overall anxiety Verbalize an understanding of the role that cognitive biases play in excessive irrational worry and persistent anxiety symptoms Identify, challenge, and replace based fearful talk Learn and implement problem solving strategies Identify and engage in pleasant activities Learning and implement personal and interpersonal skills to reduce anxiety and improve interpersonal relationships Learn to accept limitations in life and commit to tolerating, rather than avoiding, unpleasant emotions while accomplishing meaningful goals Identify major life conflicts from the past and present that form the basis for present anxiety Maintain involvement in work, family, and social activities Reestablish a consistent sleep-wake cycle Cooperate with a medical evaluation  Cooperate with a medication evaluation by a physician Verbalize an accurate understanding of depression Verbalize an understanding of the treatment Identify and replace thoughts that support depression Learn and implement behavioral strategies Verbalize an understanding and resolution of current interpersonal problems Learn and implement problem solving and decision making skills Learn and implement conflict resolution skills to resolve interpersonal problems Verbalize an understanding of healthy and unhealthy emotions verbalize insight into how past relationships may be influence current experiences with depression Use mindfulness and acceptance strategies and increase value based behavior  Increase hopeful statements about  the future.  Interventions Engage the patient in behavioral activation Use instruction, modeling, and role-playing to build the client's general social, communication, and/or conflict resolution skills Use Acceptance and Commitment Therapy to help client accept uncomfortable realities in order to accomplish value-consistent goals Reinforce the client's insight into the role of his/her past emotional pain and present anxiety  Support the client in following through with work, family, and social activities Teach and implement sleep hygiene practices  Refer the patient to a physician for a psychotropic medication consultation Monito the clint's psychotropic medication compliance Discuss how anxiety typically involves excessive worry, various bodily expressions of tension, and avoidance of what is threatening that interact to maintain the problem  Teach the patient relaxation skills Assign the patient homework Discuss examples demonstrating that unrealistic worry overestimates the probability of threats and underestimates patient's ability  Assist the patient in analyzing his or her worries Help patient understand that avoidance is reinforcing  Consistent with treatment model, discuss how change in cognitive, behavioral, and interpersonal can help client alleviate depression CBT Behavioral activation help the client explore the relationship, nature of the dispute,  Help the client develop new interpersonal skills and relationships Conduct Problem solving therapy Teach conflict resolution skills Use a process-experiential approach Conduct TLDP Conduct ACT Evaluate need for psychotropic medication Monitor adherence to medication   The patient and clinician reviewed the treatment plan on 05/17/2021. The patient approved of the treatment plan.   Hilbert Corrigan, PsyD

## 2021-08-08 ENCOUNTER — Ambulatory Visit (INDEPENDENT_AMBULATORY_CARE_PROVIDER_SITE_OTHER): Admitting: Family

## 2021-08-08 DIAGNOSIS — M5432 Sciatica, left side: Secondary | ICD-10-CM

## 2021-08-08 DIAGNOSIS — F32A Depression, unspecified: Secondary | ICD-10-CM

## 2021-08-08 DIAGNOSIS — F419 Anxiety disorder, unspecified: Secondary | ICD-10-CM

## 2021-08-08 DIAGNOSIS — R635 Abnormal weight gain: Secondary | ICD-10-CM | POA: Insufficient documentation

## 2021-08-08 MED ORDER — FLUOXETINE HCL 20 MG PO CAPS: ORAL_CAPSULE | ORAL | 1 refills | Status: DC

## 2021-08-08 NOTE — Assessment & Plan Note (Signed)
New. We discussed mindful eating and food choices as well as increasing her exercise.

## 2021-08-08 NOTE — Assessment & Plan Note (Signed)
Resolved. Monitor.  

## 2021-08-08 NOTE — Progress Notes (Signed)
Subjective:     Patient ID: Ruth Mccarthy, female    DOB: August 18, 1985, 36 y.o.   MRN: [id number]  Chief Complaint  Patient presents with   Anxiety    Here for follow up   Depression    Here for follow up    Anxiety    Depression        Past medical history includes anxiety.   Patient is in today for follow up.  Anxiety/depression- Patient reports that her depression and anxiety remain stable.  She continues prozac 20mg .   Weight gain- reports that she has been overeating. Not sure why, maybe stress eating. Not exercising regularly.   Wt Readings from Last 3 Encounters:  08/08/21 176 lb (79.8 kg)  04/05/21 159 lb 6.4 oz (72.3 kg)  03/14/21 156 lb (70.8 kg)   Sciatica- last visit she noted sciatica. Pt reports that her symptoms resolved after she removed the egg crate pad from her bed.   Health Maintenance Due  Topic Date Due   COVID-19 Vaccine (1) Never done   Hepatitis C Screening  Never done    Past Medical History:  Diagnosis Date   Broken finger    No pertinent past medical history    Spontaneous vaginal delivery 12/23/2018   SVD (spontaneous vaginal delivery) 01/08/2014    Past Surgical History:  Procedure Laterality Date   EYE SURGERY     lasix   NO PAST SURGERIES     WISDOM TOOTH EXTRACTION      Family History  Problem Relation Age of Onset   Alcohol abuse Mother    Cancer Mother        cervix   Hypertension Mother    Alcohol abuse Maternal Uncle    Hyperlipidemia Maternal Grandmother    Vision loss Maternal Grandmother    Colon polyps Maternal Grandmother    Diabetes Maternal Grandfather    Hypertension Maternal Grandfather    Stroke Maternal Grandfather    Anesthesia problems Neg Hx     Social History   Socioeconomic History   Marital status: Married    Spouse name: Not on file   Number of children: 3   Years of education: Not on file   Highest education level: Not on file  Occupational History   Occupation: 03/11/2014  Tobacco Use    Smoking status: Never   Smokeless tobacco: Never  Vaping Use   Vaping Use: Never used  Substance and Sexual Activity   Alcohol use: No   Drug use: No   Sexual activity: Yes    Partners: Male  Other Topics Concern   Not on file  Social History Narrative   Daughter- 2013 2014   Ruth Mccarthy [id number]   Daughter-2020 01-30-1996   Married   Works as a Ruth Mccarthy   Completed bachelors   Dog (Morkie- Electrical engineer)   Enjoys playing games on her phone   In-laws are local   Husband is in [employer]- deployed.    Social Determinants of Health   Financial Resource Strain: Not on file  Food Insecurity: Not on file  Transportation Needs: Not on file  Physical Activity: Not on file  Stress: Not on file  Social Connections: Not on file  Intimate Partner Violence: Not on file    Outpatient Medications Prior to Visit  Medication Sig Dispense Refill   Multiple Vitamins-Calcium (ONE-A-DAY WOMENS FORMULA) TABS Take by mouth.     FLUoxetine (PROZAC) 20 MG capsule TAKE 1 CAPSULE(20 MG) BY MOUTH  DAILY 30 capsule 2   No facility-administered medications prior to visit.    Allergies  Allergen Reactions   Kiwi Extract Itching   Shrimp [Shellfish Allergy] Diarrhea and Nausea And Vomiting    Review of Systems  Psychiatric/Behavioral:  Positive for depression.   See HPI    Objective:    Physical Exam Constitutional:      General: She is not in acute distress.    Appearance: Normal appearance. She is well-developed.  HENT:     Head: Normocephalic and atraumatic.     Right Ear: External ear normal.     Left Ear: External ear normal.  Eyes:     General: No scleral icterus. Neck:     Thyroid: No thyromegaly.  Cardiovascular:     Rate and Rhythm: Normal rate and regular rhythm.     Heart sounds: Normal heart sounds. No murmur heard. Pulmonary:     Effort: Pulmonary effort is normal. No respiratory distress.     Breath sounds: Normal breath sounds. No wheezing.   Musculoskeletal:     Cervical back: Neck supple.  Skin:    General: Skin is warm and dry.  Neurological:     Mental Status: She is alert and oriented to person, place, and time.  Psychiatric:        Mood and Affect: Mood normal.        Behavior: Behavior normal.        Thought Content: Thought content normal.        Judgment: Judgment normal.    BP 104/73 (BP Location: Right Arm, Patient Position: Sitting, Cuff Size: Small)    Pulse 76    Temp 98.3 F (36.8 C) (Oral)    Resp 16    Ht 5\' 6"  (1.676 m)    Wt 176 lb (79.8 kg)    SpO2 100%    BMI 28.41 kg/m  Wt Readings from Last 3 Encounters:  08/08/21 176 lb (79.8 kg)  04/05/21 159 lb 6.4 oz (72.3 kg)  03/14/21 156 lb (70.8 kg)       Assessment & Plan:   Problem List Items Addressed This Visit       Unprioritized   Weight gain    New. We discussed mindful eating and food choices as well as increasing her exercise.       Sciatica, left side    Resolved. Monitor.       Relevant Medications   FLUoxetine (PROZAC) 20 MG capsule   Anxiety and depression    Stable. Continue prozac 20mg .      Relevant Medications   FLUoxetine (PROZAC) 20 MG capsule   Declines covid vaccine.   I am having Ruth Mccarthy maintain her One-A-Day Womens Formula and FLUoxetine.  Meds ordered this encounter  Medications   FLUoxetine (PROZAC) 20 MG capsule    Sig: TAKE 1 CAPSULE(20 MG) BY MOUTH DAILY    Dispense:  90 capsule    Refill:  1    Order Specific Question:   Supervising Provider    Answer:   05/14/21 A [[id number]]

## 2021-08-08 NOTE — Assessment & Plan Note (Signed)
Stable. Continue prozac 20mg. 

## 2021-08-09 ENCOUNTER — Ambulatory Visit (INDEPENDENT_AMBULATORY_CARE_PROVIDER_SITE_OTHER): Admitting: Psychologist

## 2021-08-09 DIAGNOSIS — F320 Major depressive disorder, single episode, mild: Secondary | ICD-10-CM | POA: Diagnosis not present

## 2021-08-09 DIAGNOSIS — F411 Generalized anxiety disorder: Secondary | ICD-10-CM | POA: Diagnosis not present

## 2021-08-09 NOTE — Progress Notes (Signed)
[facility] Counselor/Therapist Progress Note  Patient ID: Ruth Mccarthy, MRN: [id number],    Date: 08/09/2021  Time Spent: 3:00 pm to 3:39 pm; Total Time: 39 minutes   This session was held via video webex teletherapy due to the coronavirus risk at this time. The patient consented to video teletherapy and was located at her home during this session. She is aware it is the responsibility of the patient to secure confidentiality on her end of the session. The provider was in a private home office for the duration of this session. Limits of confidentiality were discussed with the patient.   Treatment Type: Individual Therapy  Reported Symptoms: Patient described herself as experiencing less anxiety symptoms.    Mental Status Exam: Appearance:  Well Groomed     Behavior: Appropriate  Motor: Normal  Speech/Language:   Normal Rate  Affect: Appropriate  Mood: normal  Thought process: normal  Thought content:   WNL  Sensory/Perceptual disturbances:   WNL  Orientation: oriented to person, place, time/date, situation, and day of week  Attention: Good  Concentration: Good  Memory: WNL  Fund of knowledge:  Good  Insight:   Fair  Judgment:  Fair  Impulse Control: Good   Risk Assessment: Danger to Self:  No Self-injurious Behavior: No Danger to Others: No Duty to Warn:no Physical Aggression / Violence:No  Access to Firearms a concern: No  Gang Involvement:No   Subjective: Patient described herself as doing well while reflecting on events since the last session. She stated that defusion has helped her. Patient stated that she wanted to focus on mindfulness in the session. After discussing, practicing and processing mindfulness she stated that she felt better. She was agreeable to the homework and following up. She denied suicidal and homicidal ideation.   Interventions: Worked on developing a therapeutic relationship with the patient using active listening and reflective  statements. Provided emotional support using empathy and validation. Used summary statements. Praised patient for doing better and explored what has assisted the patient. Praised patient for finding defusion to be beneficial. Provided psychoeducation about mindfulness, mindful moments, mindful videos, mindful eating, and the evolution of the brain. Practiced and processed mindfulness. Praised patient for experiencing less distress. Assisted in problem solving. Provided empathic statements. Assigned homework. Assessed for suicidal and homicidal ideation.   Homework: Implement mindfulness  Next Session: Review homework and emotional support.  Diagnosis: F32.0 major depressive affective disorder, single episode, mild and F41.1 generalized anxiety disorder  Plan:   Client Abilities: Friendly and easy to develop rapport  Client Preferences: ACT and CBT  Client statement of Needs: Coping skills and process emotions  Treatment Level: Outpatient  Goals Alleviate depressive symptoms Recognize, accept, and cope with depressive feelings Develop healthy thinking patterns Develop healthy interpersonal relationships Reduce overall frequency, intensity, and duration of anxiety Stabilize anxiety level wile increasing ability to function Enhance ability to effectively cope with full variety of stressors Learn and implement coping skills that result in a reduction of anxiety   Objectives target date for all objectives is 04/19/2022 Verbalize an understanding of the cognitive, physiological, and behavioral components of anxiety Learning and implement calming skills to reduce overall anxiety Verbalize an understanding of the role that cognitive biases play in excessive irrational worry and persistent anxiety symptoms Identify, challenge, and replace based fearful talk Learn and implement problem solving strategies Identify and engage in pleasant activities Learning and implement personal and  interpersonal skills to reduce anxiety and improve interpersonal relationships Learn to accept limitations  in life and commit to tolerating, rather than avoiding, unpleasant emotions while accomplishing meaningful goals Identify major life conflicts from the past and present that form the basis for present anxiety Maintain involvement in work, family, and social activities Reestablish a consistent sleep-wake cycle Cooperate with a medical evaluation  Cooperate with a medication evaluation by a physician Verbalize an accurate understanding of depression Verbalize an understanding of the treatment Identify and replace thoughts that support depression Learn and implement behavioral strategies Verbalize an understanding and resolution of current interpersonal problems Learn and implement problem solving and decision making skills Learn and implement conflict resolution skills to resolve interpersonal problems Verbalize an understanding of healthy and unhealthy emotions verbalize insight into how past relationships may be influence current experiences with depression Use mindfulness and acceptance strategies and increase value based behavior  Increase hopeful statements about the future.  Interventions Engage the patient in behavioral activation Use instruction, modeling, and role-playing to build the client's general social, communication, and/or conflict resolution skills Use Acceptance and Commitment Therapy to help client accept uncomfortable realities in order to accomplish value-consistent goals Reinforce the client's insight into the role of his/her past emotional pain and present anxiety  Support the client in following through with work, family, and social activities Teach and implement sleep hygiene practices  Refer the patient to a physician for a psychotropic medication consultation Monito the clint's psychotropic medication compliance Discuss how anxiety typically involves excessive  worry, various bodily expressions of tension, and avoidance of what is threatening that interact to maintain the problem  Teach the patient relaxation skills Assign the patient homework Discuss examples demonstrating that unrealistic worry overestimates the probability of threats and underestimates patient's ability  Assist the patient in analyzing his or her worries Help patient understand that avoidance is reinforcing  Consistent with treatment model, discuss how change in cognitive, behavioral, and interpersonal can help client alleviate depression CBT Behavioral activation help the client explore the relationship, nature of the dispute,  Help the client develop new interpersonal skills and relationships Conduct Problem solving therapy Teach conflict resolution skills Use a process-experiential approach Conduct TLDP Conduct ACT Evaluate need for psychotropic medication Monitor adherence to medication   The patient and clinician reviewed the treatment plan on 05/17/2021. The patient approved of the treatment plan.   Hilbert Corrigan, PsyD                  Hilbert Corrigan, PsyD

## 2021-09-06 ENCOUNTER — Ambulatory Visit (INDEPENDENT_AMBULATORY_CARE_PROVIDER_SITE_OTHER): Admitting: Psychologist

## 2021-09-06 DIAGNOSIS — F411 Generalized anxiety disorder: Secondary | ICD-10-CM

## 2021-09-06 DIAGNOSIS — F320 Major depressive disorder, single episode, mild: Secondary | ICD-10-CM | POA: Diagnosis not present

## 2021-09-06 NOTE — Progress Notes (Signed)
[facility] Counselor/Therapist Progress Note ? ?Patient ID: Ruth Mccarthy, MRN: [id number],   ? ?Date: 09/06/2021 ? ?Time Spent: 3:02 pm to 3:44 pm; total time: 42 minutes ? ?This session was held via video webex teletherapy due to the coronavirus risk at this time. The patient consented to video teletherapy and was located at her home during this session. She is aware it is the responsibility of the patient to secure confidentiality on her end of the session. The provider was in a private home office for the duration of this session. Limits of confidentiality were discussed with the patient.  ? ?Treatment Type: Individual Therapy ? ?Reported Symptoms: Some anxiety.   ? ?Mental Status Exam: ?Appearance:  Well Groomed     ?Behavior: Appropriate  ?Motor: Normal  ?Speech/Language:   Normal Rate  ?Affect: Appropriate  ?Mood: normal  ?Thought process: normal  ?Thought content:   WNL  ?Sensory/Perceptual disturbances:   WNL  ?Orientation: oriented to person, place, time/date, situation, and day of week  ?Attention: Good  ?Concentration: Good  ?Memory: WNL  ?Fund of knowledge:  Good  ?Insight:   Fair  ?Judgment:  Fair  ?Impulse Control: Good  ? ?Risk Assessment: ?Danger to Self:  No ?Self-injurious Behavior: No ?Danger to Others: No ?Duty to Warn:no ?Physical Aggression / Violence:No  ?Access to Firearms a concern: No  ?Gang Involvement:No  ? ?Subjective: Patient described herself as doing well and indicated that she has a job interview on Monday. Patient spent the session reflecting on the upcoming interview. She processed thoughts and emotions related to the upcoming interview, preparing for it, and reasons as to why she is wanting to leave her current role. She also voiced having the support of her husband. She asked follow up. She denied suicidal and homicidal ideation.  ? ?Interventions: Worked on developing a therapeutic relationship with the patient using active listening and reflective statements.  Provided emotional support using empathy and validation. Praised patient for doing well and explored what has assisted the patient. Used summary statements. Processed thoughts and emotions related to applying for the job. Used socratic questions to assist the patient gain insight into self. Facilitated a role playing exercise with the patient. Assisted in doing a decisional analysis related to her current work situation. Challenged some of the unhelpful thoughts expressed. Processed thoughts and emotions. Provided empathic statements. Assessed for suicidal and homicidal ideation.  ? ?Homework: NA ? ?Next Session: Emotional support ? ?Diagnosis: F32.0 major depressive affective disorder, single episode, mild and F41.1 generalized anxiety disorder ? ?Plan:  ? ?Client Abilities: Friendly and easy to develop rapport ? ?Client Preferences: ACT and CBT ? ?Client statement of Needs: Coping skills and process emotions ? ?Treatment Level: Outpatient ? ?Goals ?Alleviate depressive symptoms ?Recognize, accept, and cope with depressive feelings ?Develop healthy thinking patterns ?Develop healthy interpersonal relationships ?Reduce overall frequency, intensity, and duration of anxiety ?Stabilize anxiety level wile increasing ability to function ?Enhance ability to effectively cope with full variety of stressors ?Learn and implement coping skills that result in a reduction of anxiety  ? ?Objectives target date for all objectives is 04/19/2022 ?Verbalize an understanding of the cognitive, physiological, and behavioral components of anxiety ?Learning and implement calming skills to reduce overall anxiety ?Verbalize an understanding of the role that cognitive biases play in excessive irrational worry and persistent anxiety symptoms ?Identify, challenge, and replace based fearful talk ?Learn and implement problem solving strategies ?Identify and engage in pleasant activities ?Learning and implement personal and interpersonal skills  to reduce anxiety and improve interpersonal relationships ?Learn to accept limitations in life and commit to tolerating, rather than avoiding, unpleasant emotions while accomplishing meaningful goals ?Identify major life conflicts from the past and present that form the basis for present anxiety ?Maintain involvement in work, family, and social activities ?Reestablish a consistent sleep-wake cycle ?Cooperate with a medical evaluation  ?Cooperate with a medication evaluation by a physician ?Verbalize an accurate understanding of depression ?Verbalize an understanding of the treatment ?Identify and replace thoughts that support depression ?Learn and implement behavioral strategies ?Verbalize an understanding and resolution of current interpersonal problems ?Learn and implement problem solving and decision making skills ?Learn and implement conflict resolution skills to resolve interpersonal problems ?Verbalize an understanding of healthy and unhealthy emotions verbalize insight into how past relationships may be influence current experiences with depression ?Use mindfulness and acceptance strategies and increase value based behavior  ?Increase hopeful statements about the future.  ?Interventions ?Engage the patient in behavioral activation ?Use instruction, modeling, and role-playing to build the client's general social, communication, and/or conflict resolution skills ?Use Acceptance and Commitment Therapy to help client accept uncomfortable realities in order to accomplish value-consistent goals ?Reinforce the client's insight into the role of his/her past emotional pain and present anxiety  ?Support the client in following through with work, family, and social activities ?Teach and implement sleep hygiene practices  ?Refer the patient to a physician for a psychotropic medication consultation ?Monito the clint's psychotropic medication compliance ?Discuss how anxiety typically involves excessive worry, various  bodily expressions of tension, and avoidance of what is threatening that interact to maintain the problem  ?Teach the patient relaxation skills ?Assign the patient homework ?Discuss examples demonstrating that unrealistic worry overestimates the probability of threats and underestimates patient's ability  ?Assist the patient in analyzing his or her worries ?Help patient understand that avoidance is reinforcing  ?Consistent with treatment model, discuss how change in cognitive, behavioral, and interpersonal can help client alleviate depression ?CBT ?Behavioral activation help the client explore the relationship, nature of the dispute,  ?Help the client develop new interpersonal skills and relationships ?Conduct Problem solving therapy ?Teach conflict resolution skills ?Use a process-experiential approach ?Conduct TLDP ?Conduct ACT ?Evaluate need for psychotropic medication ?Monitor adherence to medication  ? ?The patient and clinician reviewed the treatment plan on 05/17/2021. The patient approved of the treatment plan.  ? ?Conception Chancy, PsyD ? ? ?

## 2021-09-26 ENCOUNTER — Telehealth (INDEPENDENT_AMBULATORY_CARE_PROVIDER_SITE_OTHER): Admitting: Family

## 2021-09-26 ENCOUNTER — Encounter: Payer: Self-pay | Admitting: Family

## 2021-09-26 DIAGNOSIS — F418 Other specified anxiety disorders: Secondary | ICD-10-CM

## 2021-09-26 MED ORDER — HYDROXYZINE PAMOATE 25 MG PO CAPS: 25.0000 mg | ORAL_CAPSULE | Freq: Three times a day (TID) | ORAL | 0 refills | Status: DC | PRN

## 2021-09-26 NOTE — Telephone Encounter (Signed)
Scheduled for vv tonight ?

## 2021-09-26 NOTE — Progress Notes (Signed)
? ? ?MyChart Video Visit ? ? ? ?Virtual Visit via Video Note  ? ?This visit type was conducted due to national recommendations for restrictions regarding the COVID-19 Pandemic (e.g. social distancing) in an effort to limit this patient's exposure and mitigate transmission in our community. This patient is at least at moderate risk for complications without adequate follow up. This format is felt to be most appropriate for this patient at this time. Physical exam was limited by quality of the video and audio technology used for the visit. CMA was able to get the patient set up on a video visit. ? ?Patient location: Home Patient and provider in visit ?Provider location: Office ? ?I discussed the limitations of evaluation and management by telemedicine and the availability of in person appointments. The patient expressed understanding and agreed to proceed. ? ?Visit Date: 09/26/2021 ? ?Today's healthcare provider: Lemont Fillers, NP  ? ? ? ?Subjective:  ? ? Patient ID: Ruth Mccarthy, female    DOB: 1985-10-19, 36 y.o.   MRN: [id number] ? ?Chief Complaint  ?Patient presents with  ? Anxiety  ?  Patient complains of increased anxiety  ? ? ?Anxiety ?Symptoms include insomnia and nervous/anxious behavior.  ? ? ?Patient is in today for a virtual office visit.  ? ?Anxiety - Patient complains of anxiety symptoms. Her daughter was bitten by a dog and had to get stitches. The situation caused the patient increased anxiety. As a result,  she has been struggling to get a restful sleep. She will wake up in the middle of the night and then struggle to fall back asleep. She states that she feels better during the daytime.  ? ?Past Medical History:  ?Diagnosis Date  ? Broken finger   ? No pertinent past medical history   ? Spontaneous vaginal delivery 12/23/2018  ? SVD (spontaneous vaginal delivery) 01/08/2014  ? ? ?Past Surgical History:  ?Procedure Laterality Date  ? EYE SURGERY    ? lasix  ? NO PAST SURGERIES    ? WISDOM TOOTH  EXTRACTION    ? ? ?Family History  ?Problem Relation Age of Onset  ? Alcohol abuse Mother   ? Cancer Mother   ?     cervix  ? Hypertension Mother   ? Alcohol abuse Maternal Uncle   ? Hyperlipidemia Maternal Grandmother   ? Vision loss Maternal Grandmother   ? Colon polyps Maternal Grandmother   ? Diabetes Maternal Grandfather   ? Hypertension Maternal Grandfather   ? Stroke Maternal Grandfather   ? Anesthesia problems Neg Hx   ? ? ?Social History  ? ?Socioeconomic History  ? Marital status: Married  ?  Spouse name: Not on file  ? Number of children: 3  ? Years of education: Not on file  ? Highest education level: Not on file  ?Occupational History  ? Occupation: Runner, broadcasting/film/video  ?Tobacco Use  ? Smoking status: Never  ? Smokeless tobacco: Never  ?Vaping Use  ? Vaping Use: Never used  ?Substance and Sexual Activity  ? Alcohol use: No  ? Drug use: No  ? Sexual activity: Yes  ?  Partners: Male  ?Other Topics Concern  ? Not on file  ?Social History Narrative  ? Daughter- 2013 Shea Evans  ? Son-2015 Logan  ? Daughter-2020 Olivia  ? Married  ? Works as a Electrical engineer  ? Completed bachelors  ? Dog (Morkie- maltese/yorkie mix)  ? Enjoys playing games on her phone  ? In-laws are local  ?  Husband is in [employer]- deployed.   ? ?Social Determinants of Health  ? ?Financial Resource Strain: Not on file  ?Food Insecurity: Not on file  ?Transportation Needs: Not on file  ?Physical Activity: Not on file  ?Stress: Not on file  ?Social Connections: Not on file  ?Intimate Partner Violence: Not on file  ? ? ?Outpatient Medications Prior to Visit  ?Medication Sig Dispense Refill  ? FLUoxetine (PROZAC) 20 MG capsule TAKE 1 CAPSULE(20 MG) BY MOUTH DAILY 90 capsule 1  ? Multiple Vitamins-Calcium (ONE-A-DAY WOMENS FORMULA) TABS Take by mouth.    ? ?No facility-administered medications prior to visit.  ? ? ?Allergies  ?Allergen Reactions  ? Kiwi Extract Itching  ? Shrimp [Shellfish Allergy] Diarrhea and Nausea And Vomiting  ? ? ?Review of Systems   ?Psychiatric/Behavioral:  The patient is nervous/anxious and has insomnia.   ? ?   ?Objective:  ?  ?Physical Exam ? ?There were no vitals taken for this visit. ?Wt Readings from Last 3 Encounters:  ?08/08/21 176 lb (79.8 kg)  ?04/05/21 159 lb 6.4 oz (72.3 kg)  ?03/14/21 156 lb (70.8 kg)  ? ?Gen: Awake, alert, no acute distress ?Resp: Breathing is even and non-labored ?Psych: pleasant demeanor. Appears moderately anxious ?Neuro: Alert and Oriented x 3, + facial symmetry, speech is clear. ? ?   ?Assessment & Plan:  ? ?Problem List Items Addressed This Visit   ? ?  ? Unprioritized  ? Situational anxiety - Primary  ?  She has had acute worsening since her daughter was bitten by a dog.  I am hopeful that as her daughter heals and she gets back to work, that she will begin to feel better. In the meantime, will continue current dose of prozac and add in prn hydroxyzine to be used prn insomnia/anxiety.  ?  ?  ? Relevant Medications  ? hydrOXYzine (VISTARIL) 25 MG capsule  ? ? ? ? ?Meds ordered this encounter  ?Medications  ? hydrOXYzine (VISTARIL) 25 MG capsule  ?  Sig: Take 1 capsule (25 mg total) by mouth every 8 (eight) hours as needed.  ?  Dispense:  30 capsule  ?  Refill:  0  ?  Order Specific Question:   Supervising Provider  ?  Answer:   Danise Edge A [[id number]]  ? ? ?I discussed the assessment and treatment plan with the patient. The patient was provided an opportunity to ask questions and all were answered. The patient agreed with the plan and demonstrated an understanding of the instructions. ?  ?The patient was advised to call back or seek an in-person evaluation if the symptoms worsen or if the condition fails to improve as anticipated. ? ?I provided 20 minutes of face-to-face time during this encounter. ? ? ?Lemont Fillers, NP ?Holiday representative at [facility] ?[phone] (phone) ?[phone] (fax) ? ?[facility]  ?

## 2021-09-26 NOTE — Telephone Encounter (Signed)
Let's schedule her a virtual visit with me this week please.  ?

## 2021-09-27 DIAGNOSIS — F418 Other specified anxiety disorders: Secondary | ICD-10-CM | POA: Insufficient documentation

## 2021-09-27 NOTE — Assessment & Plan Note (Signed)
She has had acute worsening since her daughter was bitten by a dog.  I am hopeful that as her daughter heals and she gets back to work, that she will begin to feel better. In the meantime, will continue current dose of prozac and add in prn hydroxyzine to be used prn insomnia/anxiety.  ?

## 2021-10-03 ENCOUNTER — Ambulatory Visit (INDEPENDENT_AMBULATORY_CARE_PROVIDER_SITE_OTHER): Admitting: Psychologist

## 2021-10-03 DIAGNOSIS — F320 Major depressive disorder, single episode, mild: Secondary | ICD-10-CM

## 2021-10-03 DIAGNOSIS — F411 Generalized anxiety disorder: Secondary | ICD-10-CM

## 2021-10-03 NOTE — Progress Notes (Signed)
[facility] Counselor/Therapist Progress Note ? ?Patient ID: Ruth Mccarthy, MRN: [id number],   ? ?Date: 10/03/2021 ? ?Time Spent: 3:04 pm to 3:44 pm; total time: 40 minutes ? ?This session was held via video webex teletherapy due to the coronavirus risk at this time. The patient consented to video teletherapy and was located at her home during this session. She is aware it is the responsibility of the patient to secure confidentiality on her end of the session. The provider was in a private home office for the duration of this session. Limits of confidentiality were discussed with the patient.  ? ?Treatment Type: Individual Therapy ? ?Reported Symptoms: Stressors and sadness.   ? ?Mental Status Exam: ?Appearance:  Well Groomed     ?Behavior: Appropriate  ?Motor: Normal  ?Speech/Language:   Normal Rate  ?Affect: Appropriate  ?Mood: normal  ?Thought process: normal  ?Thought content:   WNL  ?Sensory/Perceptual disturbances:   WNL  ?Orientation: oriented to person, place, time/date, situation, and day of week  ?Attention: Good  ?Concentration: Good  ?Memory: WNL  ?Fund of knowledge:  Good  ?Insight:   Fair  ?Judgment:  Fair  ?Impulse Control: Good  ? ?Risk Assessment: ?Danger to Self:  No ?Self-injurious Behavior: No ?Danger to Others: No ?Duty to Warn:no ?Physical Aggression / Violence:No  ?Access to Firearms a concern: No  ?Gang Involvement:No  ? ?Subjective: Patient described herself as doing poorly and reflected on two different stressful events that have occurred recently. She talked about a situation involving her husband and then she talked about a situation involving her father (step-father). Continuing to talk, she primarily spent the session reflecting on the situation with her father in which his dog bit her daughter on the cheek resulting in her daughter getting 5 stitches. Patient voiced that the most hurtful aspect of the situation was that he lied about what occurred. She spent the  session processing her thoughts and emotions about the situation. She was agreeable to the homework discussed. She asked follow up. She denied suicidal and homicidal ideation.  ? ?Interventions: Worked on developing a therapeutic relationship with the patient using active listening and reflective statements. Provided emotional support using empathy and validation. Used summary statements. Normalized and validated expressed thoughts and emotions. Praised patient for how she responded to the situation immediately following how it occurred. Processed the different thoughts and emotions. Used socratic questions to assist the patient gain insight into self. Assisted the patient in doing a decisional analysis. Explored different ways that patient could express emotions to father. Assisted in problem solving. Challenged some of the thoughts expressed. Reflected on ways that patient can focus on caring for self. Identified steps to implement self-care. Processed the idea of writing a letter to emotions she is currently experiencing in relation to her father. Assigned homework Provided empathic statements. Assessed for suicidal and homicidal ideation.  ? ?Homework: Write letter to father and to emotions.  ? ?Next Session: Emotional support ? ?Diagnosis: F32.0 major depressive affective disorder, single episode, mild and F41.1 generalized anxiety disorder ? ?Plan:  ? ?Client Abilities: Friendly and easy to develop rapport ? ?Client Preferences: ACT and CBT ? ?Client statement of Needs: Coping skills and process emotions ? ?Treatment Level: Outpatient ? ?Goals ?Alleviate depressive symptoms ?Recognize, accept, and cope with depressive feelings ?Develop healthy thinking patterns ?Develop healthy interpersonal relationships ?Reduce overall frequency, intensity, and duration of anxiety ?Stabilize anxiety level wile increasing ability to function ?Enhance ability to effectively cope with full variety  of stressors ?Learn and  implement coping skills that result in a reduction of anxiety  ? ?Objectives target date for all objectives is 04/19/2022 ?Verbalize an understanding of the cognitive, physiological, and behavioral components of anxiety ?Learning and implement calming skills to reduce overall anxiety ?Verbalize an understanding of the role that cognitive biases play in excessive irrational worry and persistent anxiety symptoms ?Identify, challenge, and replace based fearful talk ?Learn and implement problem solving strategies ?Identify and engage in pleasant activities ?Learning and implement personal and interpersonal skills to reduce anxiety and improve interpersonal relationships ?Learn to accept limitations in life and commit to tolerating, rather than avoiding, unpleasant emotions while accomplishing meaningful goals ?Identify major life conflicts from the past and present that form the basis for present anxiety ?Maintain involvement in work, family, and social activities ?Reestablish a consistent sleep-wake cycle ?Cooperate with a medical evaluation  ?Cooperate with a medication evaluation by a physician ?Verbalize an accurate understanding of depression ?Verbalize an understanding of the treatment ?Identify and replace thoughts that support depression ?Learn and implement behavioral strategies ?Verbalize an understanding and resolution of current interpersonal problems ?Learn and implement problem solving and decision making skills ?Learn and implement conflict resolution skills to resolve interpersonal problems ?Verbalize an understanding of healthy and unhealthy emotions verbalize insight into how past relationships may be influence current experiences with depression ?Use mindfulness and acceptance strategies and increase value based behavior  ?Increase hopeful statements about the future.  ?Interventions ?Engage the patient in behavioral activation ?Use instruction, modeling, and role-playing to build the client's general  social, communication, and/or conflict resolution skills ?Use Acceptance and Commitment Therapy to help client accept uncomfortable realities in order to accomplish value-consistent goals ?Reinforce the client's insight into the role of his/her past emotional pain and present anxiety  ?Support the client in following through with work, family, and social activities ?Teach and implement sleep hygiene practices  ?Refer the patient to a physician for a psychotropic medication consultation ?Monito the clint's psychotropic medication compliance ?Discuss how anxiety typically involves excessive worry, various bodily expressions of tension, and avoidance of what is threatening that interact to maintain the problem  ?Teach the patient relaxation skills ?Assign the patient homework ?Discuss examples demonstrating that unrealistic worry overestimates the probability of threats and underestimates patient's ability  ?Assist the patient in analyzing his or her worries ?Help patient understand that avoidance is reinforcing  ?Consistent with treatment model, discuss how change in cognitive, behavioral, and interpersonal can help client alleviate depression ?CBT ?Behavioral activation help the client explore the relationship, nature of the dispute,  ?Help the client develop new interpersonal skills and relationships ?Conduct Problem solving therapy ?Teach conflict resolution skills ?Use a process-experiential approach ?Conduct TLDP ?Conduct ACT ?Evaluate need for psychotropic medication ?Monitor adherence to medication  ? ?The patient and clinician reviewed the treatment plan on 05/17/2021. The patient approved of the treatment plan.  ? ?Hilbert Corrigan, PsyD ? ?

## 2021-10-04 ENCOUNTER — Ambulatory Visit
Admission: RE | Admit: 2021-10-04 | Discharge: 2021-10-04 | Disposition: A | Discharge status: Home / Self Care | Source: Ambulatory Visit | Attending: Obstetrics and Gynecology | Admitting: Obstetrics and Gynecology

## 2021-10-04 DIAGNOSIS — N644 Mastodynia: Secondary | ICD-10-CM

## 2021-11-08 ENCOUNTER — Ambulatory Visit (INDEPENDENT_AMBULATORY_CARE_PROVIDER_SITE_OTHER): Admitting: Psychologist

## 2021-11-08 DIAGNOSIS — F411 Generalized anxiety disorder: Secondary | ICD-10-CM | POA: Diagnosis not present

## 2021-11-08 DIAGNOSIS — F320 Major depressive disorder, single episode, mild: Secondary | ICD-10-CM

## 2021-11-08 NOTE — Progress Notes (Signed)
[facility] Counselor/Therapist Progress Note ? ?Patient ID: Ruth Mccarthy, MRN: [id number],   ? ?Date: 11/08/2021 ? ?Time Spent: 3:04 pm to 3:25 pm; total time: 21 minutes ? ?This session was held via video webex teletherapy due to the coronavirus risk at this time. The patient consented to video teletherapy and was located at her home during this session. She is aware it is the responsibility of the patient to secure confidentiality on her end of the session. The provider was in a private home office for the duration of this session. Limits of confidentiality were discussed with the patient.  ? ?Treatment Type: Individual Therapy ? ?Reported Symptoms: Less stressors and overall doing well   ? ?Mental Status Exam: ?Appearance:  Well Groomed     ?Behavior: Appropriate  ?Motor: Normal  ?Speech/Language:   Normal Rate  ?Affect: Appropriate  ?Mood: normal  ?Thought process: normal  ?Thought content:   WNL  ?Sensory/Perceptual disturbances:   WNL  ?Orientation: oriented to person, place, time/date, situation, and day of week  ?Attention: Good  ?Concentration: Good  ?Memory: WNL  ?Fund of knowledge:  Good  ?Insight:   Fair  ?Judgment:  Fair  ?Impulse Control: Good  ? ?Risk Assessment: ?Danger to Self:  No ?Self-injurious Behavior: No ?Danger to Others: No ?Duty to Warn:no ?Physical Aggression / Violence:No  ?Access to Firearms a concern: No  ?Gang Involvement:No  ? 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 She also stated that she has turned in her resignation letter. She reflected on other events that have occurred. Continuing to talk, she voiced that she feels as though she is in a good place as she reflected on potential barriers and how to overcome them. She asked follow up. She denied suicidal and homicidal ideation.  ? ?Interventions: Worked on developing a therapeutic  relationship with the patient using active listening and reflective statements. Provided emotional support using empathy and validation. Reflected on events since the last session. Praised patient for doing well. Normalized and validated expressed thoughts and emotions. Explored emotions and thoughts related to the new job in the fall. Used socratic questions to assist the patient gain insight into self. Explored potential barriers and how to overcome those barriers. Assisted in problem solving. Reflected on patient's growth in counseling. Discussed next steps for counseling. Provided empathic statements. Assessed for suicidal and homicidal ideation.  ? ?Homework: NA ? ?Next Session: Emotional support. Possibly terminate counseling ? ?Diagnosis: F32.0 major depressive affective disorder, single episode, mild and F41.1 generalized anxiety disorder ? ?Plan:  ? ?Client Abilities: Friendly and easy to develop rapport ? ?Client Preferences: ACT and CBT ? ?Client statement of Needs: Coping skills and process emotions ? ?Treatment Level: Outpatient ? ?Goals ?Alleviate depressive symptoms ?Recognize, accept, and cope with depressive feelings ?Develop healthy thinking patterns ?Develop healthy interpersonal relationships ?Reduce overall frequency, intensity, and duration of anxiety ?Stabilize anxiety level wile increasing ability to function ?Enhance ability to effectively cope with full variety of stressors ?Learn and implement coping skills that result in a reduction of anxiety  ? ?Objectives target date for all objectives is 04/19/2022 ?Verbalize an understanding of the cognitive, physiological, and behavioral components of anxiety ?Learning and implement calming skills to reduce overall anxiety ?Verbalize an understanding of the role that cognitive biases play in excessive irrational worry and persistent anxiety symptoms ?Identify, challenge, and replace based fearful talk ?Learn  and implement problem solving  strategies ?Identify and engage in pleasant activities ?Learning and implement personal and interpersonal skills to reduce anxiety and improve interpersonal relationships ?Learn to accept limitations in life and commit to tolerating, rather than avoiding, unpleasant emotions while accomplishing meaningful goals ?Identify major life conflicts from the past and present that form the basis for present anxiety ?Maintain involvement in work, family, and social activities ?Reestablish a consistent sleep-wake cycle ?Cooperate with a medical evaluation  ?Cooperate with a medication evaluation by a physician ?Verbalize an accurate understanding of depression ?Verbalize an understanding of the treatment ?Identify and replace thoughts that support depression ?Learn and implement behavioral strategies ?Verbalize an understanding and resolution of current interpersonal problems ?Learn and implement problem solving and decision making skills ?Learn and implement conflict resolution skills to resolve interpersonal problems ?Verbalize an understanding of healthy and unhealthy emotions verbalize insight into how past relationships may be influence current experiences with depression ?Use mindfulness and acceptance strategies and increase value based behavior  ?Increase hopeful statements about the future.  ?Interventions ?Engage the patient in behavioral activation ?Use instruction, modeling, and role-playing to build the client's general social, communication, and/or conflict resolution skills ?Use Acceptance and Commitment Therapy to help client accept uncomfortable realities in order to accomplish value-consistent goals ?Reinforce the client's insight into the role of his/her past emotional pain and present anxiety  ?Support the client in following through with work, family, and social activities ?Teach and implement sleep hygiene practices  ?Refer the patient to a physician for a psychotropic medication consultation ?Monito the  clint's psychotropic medication compliance ?Discuss how anxiety typically involves excessive worry, various bodily expressions of tension, and avoidance of what is threatening that interact to maintain the problem  ?Teach the patient relaxation skills ?Assign the patient homework ?Discuss examples demonstrating that unrealistic worry overestimates the probability of threats and underestimates patient's ability  ?Assist the patient in analyzing his or her worries ?Help patient understand that avoidance is reinforcing  ?Consistent with treatment model, discuss how change in cognitive, behavioral, and interpersonal can help client alleviate depression ?CBT ?Behavioral activation help the client explore the relationship, nature of the dispute,  ?Help the client develop new interpersonal skills and relationships ?Conduct Problem solving therapy ?Teach conflict resolution skills ?Use a process-experiential approach ?Conduct TLDP ?Conduct ACT ?Evaluate need for psychotropic medication ?Monitor adherence to medication  ? ?The patient and clinician reviewed the treatment plan on 05/17/2021. The patient approved of the treatment plan.  ? ?Hilbert Corrigan, PsyD ? ? ?

## 2021-12-11 ENCOUNTER — Ambulatory Visit (INDEPENDENT_AMBULATORY_CARE_PROVIDER_SITE_OTHER): Admitting: Family

## 2021-12-11 VITALS — BP 106/77 | HR 69 | Temp 98.2°F | Resp 16 | Wt 193.0 lb

## 2021-12-11 DIAGNOSIS — R238 Other skin changes: Secondary | ICD-10-CM | POA: Diagnosis not present

## 2021-12-11 DIAGNOSIS — R635 Abnormal weight gain: Secondary | ICD-10-CM

## 2021-12-11 DIAGNOSIS — L03019 Cellulitis of unspecified finger: Secondary | ICD-10-CM | POA: Insufficient documentation

## 2021-12-11 LAB — TSH: TSH: 2.34 u[IU]/mL (ref 0.35–5.50)

## 2021-12-11 MED ORDER — CEPHALEXIN 500 MG PO CAPS: 500.0000 mg | ORAL_CAPSULE | Freq: Three times a day (TID) | ORAL | 0 refills | Status: DC

## 2021-12-11 NOTE — Progress Notes (Signed)
Subjective:   By signing my name below, I, Ruth Mccarthy, attest that this documentation has been prepared under the direction and in the presence of Sandford Craze, NP 12/11/2021     Patient ID: Ruth Mccarthy, female    DOB: [birth year], 36 y.o.   MRN: [id number]  Chief Complaint  Patient presents with   Hair/Scalp Problem    Scalp irritation and itchiness    Hand Pain    Patient reports  infection on middle finger if the left hand.    Hand Pain   Patient is in today for a office visit.   Scalp irritation- She complains of bumps, scalp irritation and itching around her hairline on the back of her head. It started out as small area of irration and itching but she was frequently scratching it and found it spread further up her scalp.   Swollen finger- She complains of sore pain and swelling on the 3rd digit of her left hand.   Fluoxitine- She stopped taking fluoxitine due to weight gain. She reports having unexplained episodes of crying while off fluoxitine.  Wt Readings from Last 3 Encounters:  12/11/21 193 lb (87.5 kg)  08/08/21 176 lb (79.8 kg)  04/05/21 159 lb 6.4 oz (72.3 kg)    Health Maintenance Due  Topic Date Due   Hepatitis C Screening  Never done    Past Medical History:  Diagnosis Date   Broken finger    No pertinent past medical history    Spontaneous vaginal delivery 12/23/2018   SVD (spontaneous vaginal delivery) 01/08/2014    Past Surgical History:  Procedure Laterality Date   EYE SURGERY     lasix   NO PAST SURGERIES     WISDOM TOOTH EXTRACTION      Family History  Problem Relation Age of Onset   Alcohol abuse Mother    Cancer Mother        cervix   Hypertension Mother    Alcohol abuse Maternal Uncle    Hyperlipidemia Maternal Grandmother    Vision loss Maternal Grandmother    Colon polyps Maternal Grandmother    Diabetes Maternal Grandfather    Hypertension Maternal Grandfather    Stroke Maternal Grandfather    Anesthesia problems  Neg Hx     Social History   Socioeconomic History   Marital status: Married    Spouse name: Not on file   Number of children: 3   Years of education: Not on file   Highest education level: Not on file  Occupational History   Occupation: Runner, broadcasting/film/video  Tobacco Use   Smoking status: Never   Smokeless tobacco: Never  Vaping Use   Vaping Use: Never used  Substance and Sexual Activity   Alcohol use: No   Drug use: No   Sexual activity: Yes    Partners: Male  Other Topics Concern   Not on file  Social History Narrative   Daughter- 2013 Shea Evans   [id number] [employer]   Daughter-2020 Zollie Scale   Married   Works as a Electrical engineer   Completed bachelors   Dog (Morkie- Cytogeneticist)   Enjoys playing games on her phone   In-laws are local   Husband is in [employer]- deployed.    Social Determinants of Health   Financial Resource Strain: Not on file  Food Insecurity: Not on file  Transportation Needs: Not on file  Physical Activity: Not on file  Stress: Not on file  Social Connections: Not on file  Intimate  Partner Violence: Not on file    Outpatient Medications Prior to Visit  Medication Sig Dispense Refill   hydrOXYzine (VISTARIL) 25 MG capsule Take 1 capsule (25 mg total) by mouth every 8 (eight) hours as needed. 30 capsule 0   Multiple Vitamins-Calcium (ONE-A-DAY WOMENS FORMULA) TABS Take by mouth.     FLUoxetine (PROZAC) 20 MG capsule TAKE 1 CAPSULE(20 MG) BY MOUTH DAILY (Patient not taking: Reported on 12/11/2021) 90 capsule 1   No facility-administered medications prior to visit.    Allergies  Allergen Reactions   Kiwi Extract Itching   Shrimp [Shellfish Allergy] Diarrhea and Nausea And Vomiting    Review of Systems  Constitutional:        (+)unexpected weight gain  Musculoskeletal:        (+)sore pain on 3rd digit of left hand (+)swelling on 3rd digit of left hand  Skin:  Positive for itching (scalp).       (+)irritation on scalp (+)redness of nail bed of 3rd  digit on left hand      Objective:    Physical Exam Constitutional:      General: She is not in acute distress.    Appearance: Normal appearance. She is not ill-appearing.  HENT:     Head: Normocephalic and atraumatic.     Right Ear: External ear normal.     Left Ear: External ear normal.  Eyes:     Extraocular Movements: Extraocular movements intact.     Pupils: Pupils are equal, round, and reactive to light.  Cardiovascular:     Rate and Rhythm: Normal rate and regular rhythm.     Heart sounds: Normal heart sounds. No murmur heard.   No gallop.  Pulmonary:     Effort: Pulmonary effort is normal. No respiratory distress.     Breath sounds: Normal breath sounds. No wheezing or rales.  Skin:    General: Skin is warm and dry.     Comments: Erythema on the base of the left middle finger nail bed  Multiple small scab lesions on base of upper scalp and anterior hairline  Neurological:     Mental Status: She is alert and oriented to person, place, and time.  Psychiatric:        Judgment: Judgment normal.     BP 106/77 (BP Location: Right Arm, Patient Position: Sitting, Cuff Size: Small)   Pulse 69   Temp 98.2 F (36.8 C) (Oral)   Resp 16   Wt 193 lb (87.5 kg)   SpO2 100%   BMI 31.15 kg/m  Wt Readings from Last 3 Encounters:  12/11/21 193 lb (87.5 kg)  08/08/21 176 lb (79.8 kg)  04/05/21 159 lb 6.4 oz (72.3 kg)       Assessment & Plan:   Problem List Items Addressed This Visit       Unprioritized   Weight gain - Primary    Wt Readings from Last 3 Encounters:  12/11/21 193 lb (87.5 kg)  08/08/21 176 lb (79.8 kg)  04/05/21 159 lb 6.4 oz (72.3 kg)  She attributes the weight gain to prozac.  She stopped prozac about 1 month ago and seems to be doing ok off of it.  Will check tsh, discussed healthy diet, exercise, weight loss.       Relevant Orders   TSH   Paronychia of finger    New. Pt is advised as follows:  Soak your finger twice daily in warm  water. Start keflex 3x daily for skin  infection in your finger.  Call if increased pain/swelling/redness drainage or if not improved in 1 week.      Relevant Medications   cephALEXin (KEFLEX) 500 MG capsule   Dry scalp    New. Pt is advised as follows:  For scalp, please use neutrogena t gel 2-3 times a week. Let me know if your scabbing does not improve.  Keflex should help if any of the spots are infected.         Meds ordered this encounter  Medications   cephALEXin (KEFLEX) 500 MG capsule    Sig: Take 1 capsule (500 mg total) by mouth 3 (three) times daily.    Dispense:  21 capsule    Refill:  0    Order Specific Question:   Supervising Provider    Answer:   Danise Edge A [[id number]]    I, Lemont Fillers, NP, personally preformed the services described in this documentation.  All medical record entries made by the scribe were at my direction and in my presence.  I have reviewed the chart and discharge instructions (if applicable) and agree that the record reflects my personal performance and is accurate and complete. 12/11/2021   I,Ruth Mccarthy,acting as a scribe for Lemont Fillers, NP.,have documented all relevant documentation on the behalf of Lemont Fillers, NP,as directed by  Lemont Fillers, NP while in the presence of Lemont Fillers, NP.   Lemont Fillers, NP

## 2021-12-11 NOTE — Assessment & Plan Note (Signed)
New. Pt is advised as follows:  Soak your finger twice daily in warm water. Start keflex 3x daily for skin infection in your finger.  Call if increased pain/swelling/redness drainage or if not improved in 1 week.

## 2021-12-11 NOTE — Assessment & Plan Note (Signed)
New. Pt is advised as follows:  For scalp, please use neutrogena t gel 2-3 times a week. Let me know if your scabbing does not improve.  Keflex should help if any of the spots are infected.

## 2021-12-11 NOTE — Assessment & Plan Note (Signed)
Wt Readings from Last 3 Encounters:  12/11/21 193 lb (87.5 kg)  08/08/21 176 lb (79.8 kg)  04/05/21 159 lb 6.4 oz (72.3 kg)   She attributes the weight gain to prozac.  She stopped prozac about 1 month ago and seems to be doing ok off of it.  Will check tsh, discussed healthy diet, exercise, weight loss.

## 2021-12-11 NOTE — Patient Instructions (Signed)
Soak your finger twice daily in warm water. Start keflex 3x daily for skin infection in your finger.  Call if increased pain/swelling/redness drainage or if not improved in 1 week.  For scalp, please use neutrogena t gel 2-3 times a week. Let me know if your scabbing dose not improve.

## 2022-02-12 ENCOUNTER — Other Ambulatory Visit: Payer: Self-pay | Admitting: Family

## 2022-02-19 ENCOUNTER — Encounter: Admitting: Family

## 2022-02-26 ENCOUNTER — Encounter: Admitting: Family

## 2022-03-13 ENCOUNTER — Encounter: Payer: Self-pay | Admitting: Family

## 2022-03-13 ENCOUNTER — Ambulatory Visit (INDEPENDENT_AMBULATORY_CARE_PROVIDER_SITE_OTHER): Admitting: Family

## 2022-03-13 VITALS — BP 108/72 | HR 74 | Temp 98.5°F | Resp 16 | Ht 66.0 in | Wt 197.0 lb

## 2022-03-13 DIAGNOSIS — Z0000 Encounter for general adult medical examination without abnormal findings: Secondary | ICD-10-CM

## 2022-03-13 DIAGNOSIS — F419 Anxiety disorder, unspecified: Secondary | ICD-10-CM

## 2022-03-13 DIAGNOSIS — R238 Other skin changes: Secondary | ICD-10-CM | POA: Diagnosis not present

## 2022-03-13 DIAGNOSIS — F32A Depression, unspecified: Secondary | ICD-10-CM | POA: Diagnosis not present

## 2022-03-13 MED ORDER — FLUOCINONIDE 0.05 % EX SOLN: CUTANEOUS | 1 refills | Status: DC

## 2022-03-13 NOTE — Assessment & Plan Note (Signed)
Feeling well on prozac.  Continue same.

## 2022-03-13 NOTE — Progress Notes (Signed)
Subjective:   By signing my name below, I, Cassell Clement, attest that this documentation has been prepared under the direction and in the presence of Birdie Sons, NP 03/13/2022   Patient ID: Ruth Mccarthy, female    DOB: 02-13-1986, 36 y.o.   MRN: [id number]  Chief Complaint  Patient presents with   Annual Exam         HPI Patient is in today for a comprehensive physical exam   Constipation: She has mild constipation.  Headaches: She states that she was having headaches for the past week.  Dry Scalp: She states that her dry and itchy scalp is persistent. The area becomes frequently scabby. She uses a type of psoriasis shampoo. She washes her hair about every other day. She states that washing her hair decrease the severity of itchiness.  Mood: She believes that her 20 Mg of Prozac medication is helping her feel happy. She started taking the medication about two weeks before school started and since then, has been feeling at eased.  Cholesterol: Her cholesterol levels are slightly elevating. She states that she has a family history of high cholesterol.  Lab Results  Component Value Date   CHOL 211 (H) 02/17/2021   HDL 72.80 02/17/2021   LDLCALC 107 (H) 02/17/2021   TRIG 157.0 (H) 02/17/2021   CHOLHDL 3 02/17/2021   She denies having any fever, new muscle pain, joint pain , new moles, rashes, congestion, sinus pain, sore throat, palpations, cough, SOB ,wheezing,n/v/d constipation, blood in stool, dysuria, frequency, hematuria, depression, anxiety, headaches at this time  Social history: She denies of any changes to her family medical history. 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   Pap Smear: Last completed on 02/09/2021 Mammogram: Last completed on 09/30/2020 Immunizations: She is UTD on tetanus vaccine. She is not interested in receiving an influenza vaccine during today's visit. She has not received any of the Covid-19 vaccines.  Exercise: She  has begun to go to the gym. She is trying to go to the gym about twice a week.  Dental: She is UTD on dental exams.  Vision: She is not UTD on vision exams    Health Maintenance Due  Topic Date Due   Hepatitis C Screening  Never done   INFLUENZA VACCINE  02/06/2022    Past Medical History:  Diagnosis Date   Broken finger    No pertinent past medical history    Spontaneous vaginal delivery 12/23/2018   SVD (spontaneous vaginal delivery) 01/08/2014    Past Surgical History:  Procedure Laterality Date   EYE SURGERY     lasix   NO PAST SURGERIES     WISDOM TOOTH EXTRACTION      Family History  Problem Relation Age of Onset   Alcohol abuse Mother    Cancer Mother        cervix   Hypertension Mother    Alcohol abuse Maternal Uncle    Hyperlipidemia Maternal Grandmother    Vision loss Maternal Grandmother    Colon polyps Maternal Grandmother    Diabetes Maternal Grandfather    Hypertension Maternal Grandfather    Stroke Maternal Grandfather    Anesthesia problems Neg Hx     Social History   Socioeconomic History   Marital status: Married    Spouse name: Not on file   Number of children: 3   Years of education: Not on file   Highest education level: Not on file  Occupational  History   Occupation: Runner, broadcasting/film/video  Tobacco Use   Smoking status: Never   Smokeless tobacco: Never  Vaping Use   Vaping Use: Never used  Substance and Sexual Activity   Alcohol use: No   Drug use: No   Sexual activity: Yes    Partners: Male    Birth control/protection: Condom  Other Topics Concern   Not on file  Social History Narrative   Daughter- 2013 Shea Evans   [id number] [employer]   Daughter-2020 Zollie Scale   Married   Works as a Therapist, sports   Completed bachelors   Dog (Morkie- Cytogeneticist)   Enjoys playing games on her phone   In-laws are local   Husband is in [employer]- deployed.    Social Determinants of Health   Financial Resource Strain: Low Risk  (12/15/2018)    Overall Financial Resource Strain (CARDIA)    Difficulty of Paying Living Expenses: Not hard at all  Food Insecurity: No Food Insecurity (12/15/2018)   Hunger Vital Sign    Worried About Running Out of Food in the Last Year: Never true    Ran Out of Food in the Last Year: Never true  Transportation Needs: Unknown (12/15/2018)   PRAPARE - Administrator, Civil Service (Medical): No    Lack of Transportation (Non-Medical): Not on file  Physical Activity: Insufficiently Active (01/25/2020)   Exercise Vital Sign    Days of Exercise per Week: 1 day    Minutes of Exercise per Session: 60 min  Stress: No Stress Concern Present (12/15/2018)   [employer] of Occupational Health - Occupational Stress Questionnaire    Feeling of Stress : Only a little  Social Connections: Not on file  Intimate Partner Violence: Not At Risk (12/15/2018)   Humiliation, Afraid, Rape, and Kick questionnaire    Fear of Current or Ex-Partner: No    Emotionally Abused: No    Physically Abused: No    Sexually Abused: No    Outpatient Medications Prior to Visit  Medication Sig Dispense Refill   FLUoxetine (PROZAC) 20 MG capsule TAKE 1 CAPSULE(20 MG) BY MOUTH DAILY 90 capsule 1   cephALEXin (KEFLEX) 500 MG capsule Take 1 capsule (500 mg total) by mouth 3 (three) times daily. 21 capsule 0   hydrOXYzine (VISTARIL) 25 MG capsule Take 1 capsule (25 mg total) by mouth every 8 (eight) hours as needed. 30 capsule 0   Multiple Vitamins-Calcium (ONE-A-DAY WOMENS FORMULA) TABS Take by mouth.     No facility-administered medications prior to visit.    Allergies  Allergen Reactions   Kiwi Extract Itching   Shrimp [Shellfish Allergy] Diarrhea and Nausea And Vomiting    Review of Systems  Constitutional:  Negative for fever.  HENT:  Negative for congestion, sinus pain and sore throat.   Respiratory:  Negative for cough, shortness of breath and wheezing.   Cardiovascular:  Negative for palpitations.   Gastrointestinal:  Negative for blood in stool, constipation, diarrhea, nausea and vomiting.  Genitourinary:  Negative for dysuria, frequency and hematuria.  Musculoskeletal:  Negative for joint pain and myalgias.  Skin:  Negative for rash.       (-) New Moles  Neurological:  Negative for headaches.  Psychiatric/Behavioral:  Negative for depression. The patient is not nervous/anxious.        Objective:    Physical Exam Constitutional:      General: She is not in acute distress.    Appearance: Normal appearance. She is not ill-appearing.  HENT:     Head: Normocephalic and atraumatic.     Right Ear: Tympanic membrane, ear canal and external ear normal.     Left Ear: Tympanic membrane, ear canal and external ear normal.  Eyes:     Extraocular Movements: Extraocular movements intact.     Pupils: Pupils are equal, round, and reactive to light.  Cardiovascular:     Rate and Rhythm: Normal rate and regular rhythm.     Heart sounds: Normal heart sounds. No murmur heard.    No gallop.  Pulmonary:     Effort: Pulmonary effort is normal. No respiratory distress.     Breath sounds: Normal breath sounds. No wheezing or rales.  Abdominal:     General: Bowel sounds are normal. There is no distension.     Palpations: Abdomen is soft.     Tenderness: There is no abdominal tenderness. There is no guarding.  Musculoskeletal:     Comments: 5/5 strength in both upper and lower extremities    Skin:    General: Skin is warm and dry.  Neurological:     Mental Status: She is alert and oriented to person, place, and time.     Deep Tendon Reflexes:     Reflex Scores:      Patellar reflexes are 2+ on the right side and 2+ on the left side. Psychiatric:        Mood and Affect: Mood normal.        Behavior: Behavior normal.        Judgment: Judgment normal.     BP 108/72 (BP Location: Right Arm, Patient Position: Sitting, Cuff Size: Small)   Pulse 74   Temp 98.5 F (36.9 C) (Oral)   Resp 16    Ht 5\' 6"  (1.676 m)   Wt 197 lb (89.4 kg)   SpO2 99%   BMI 31.80 kg/m  Wt Readings from Last 3 Encounters:  03/13/22 197 lb (89.4 kg)  12/11/21 193 lb (87.5 kg)  08/08/21 176 lb (79.8 kg)       Assessment & Plan:   Problem List Items Addressed This Visit       Unprioritized   Preventative health care - Primary    Wt Readings from Last 3 Encounters:  03/13/22 197 lb (89.4 kg)  12/11/21 193 lb (87.5 kg)  08/08/21 176 lb (79.8 kg)  Continue healthy diet, exercise and weight loss efforts. Pap up to date. Declines flu shot. I encouraged her to think about getting the updated covid booster this fall.       Dry scalp    Trial of lidex solution. Also recommended that she try selsun blue shampoo.       Anxiety and depression    Feeling well on prozac.  Continue same.       Meds ordered this encounter  Medications   fluocinonide (LIDEX) 0.05 % external solution    Sig: Apply to scalp several times a week as needed for itching    Dispense:  60 mL    Refill:  1    Order Specific Question:   Supervising Provider    Answer:   08/10/21 A [4243]    I, Danise Edge, NP, personally preformed the services described in this documentation.  All medical record entries made by the scribe were at my direction and in my presence.  I have reviewed the chart and discharge instructions (if applicable) and agree that the record reflects my personal performance and is  accurate and complete. 03/13/2022   I,Amber Collins,acting as a scribe for Lemont Fillers, NP.,have documented all relevant documentation on the behalf of Lemont Fillers, NP,as directed by  Lemont Fillers, NP while in the presence of Lemont Fillers, NP.    Lemont Fillers, NP

## 2022-03-13 NOTE — Assessment & Plan Note (Addendum)
Wt Readings from Last 3 Encounters:  03/13/22 197 lb (89.4 kg)  12/11/21 193 lb (87.5 kg)  08/08/21 176 lb (79.8 kg)   Continue healthy diet, exercise and weight loss efforts. Pap up to date. Declines flu shot. I encouraged her to think about getting the updated covid booster this fall.

## 2022-03-13 NOTE — Patient Instructions (Signed)
Please schedule a routine vision exam.  

## 2022-03-13 NOTE — Assessment & Plan Note (Addendum)
Trial of lidex solution. Also recommended that she try selsun blue shampoo.

## 2022-03-14 ENCOUNTER — Ambulatory Visit: Admitting: Psychologist

## 2022-04-27 ENCOUNTER — Encounter: Payer: Self-pay | Admitting: Family

## 2022-04-27 DIAGNOSIS — L299 Pruritus, unspecified: Secondary | ICD-10-CM

## 2022-05-20 IMAGING — US US BREAST*L* LIMITED INC AXILLA
1 series · 9 of 9 positions shown · non-contrast
Comparison: None.

CLINICAL DATA: 35-year-old presenting with focal pain involving the
LOWER INNER LEFT breast. This is the patient's initial baseline
mammogram.

Patient states no family history of breast cancer.
EXAM:
DIGITAL DIAGNOSTIC BILATERAL MAMMOGRAM WITH TOMOSYNTHESIS AND CAD
TECHNIQUE: Bilateral digital diagnostic mammography and breast tomosynthesis
was performed. The images were evaluated with computer-aided
detection.

[Series 1: us breast*left* limited inc axilla · 0.05mm/px · 9 of 9 slices shown]
[im 1/9]
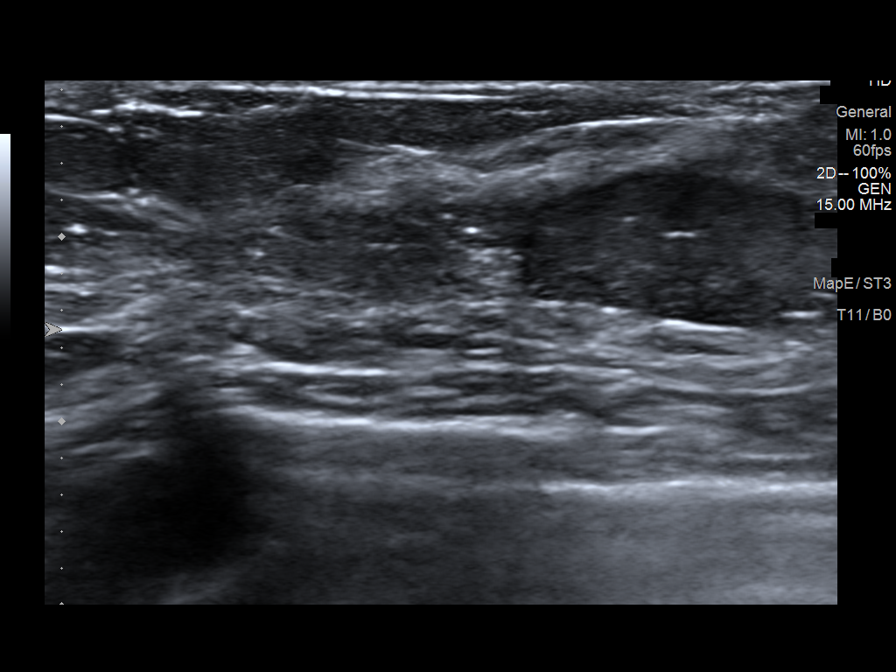
[im 2/9]
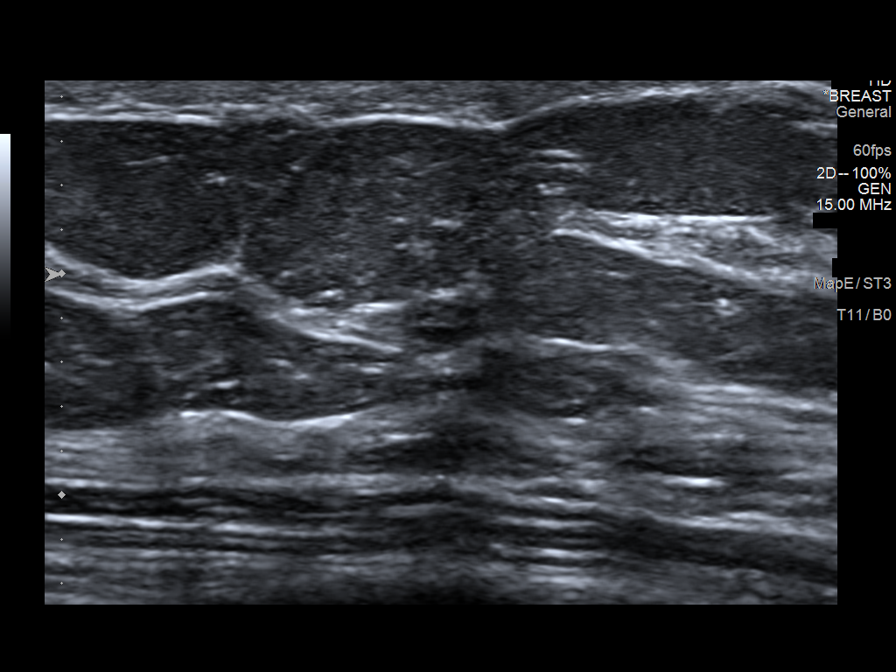
[im 3/9]
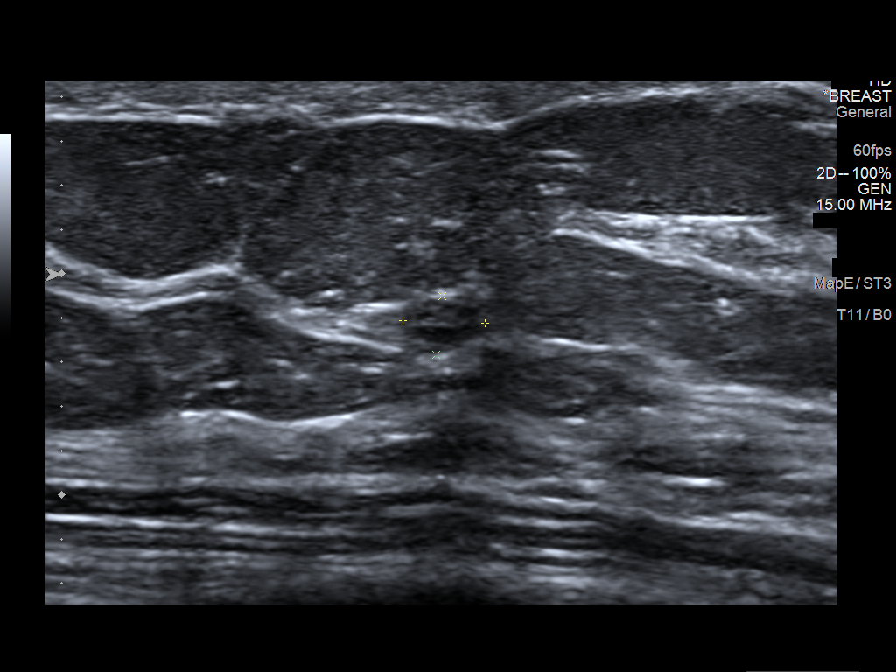
[im 4/9]
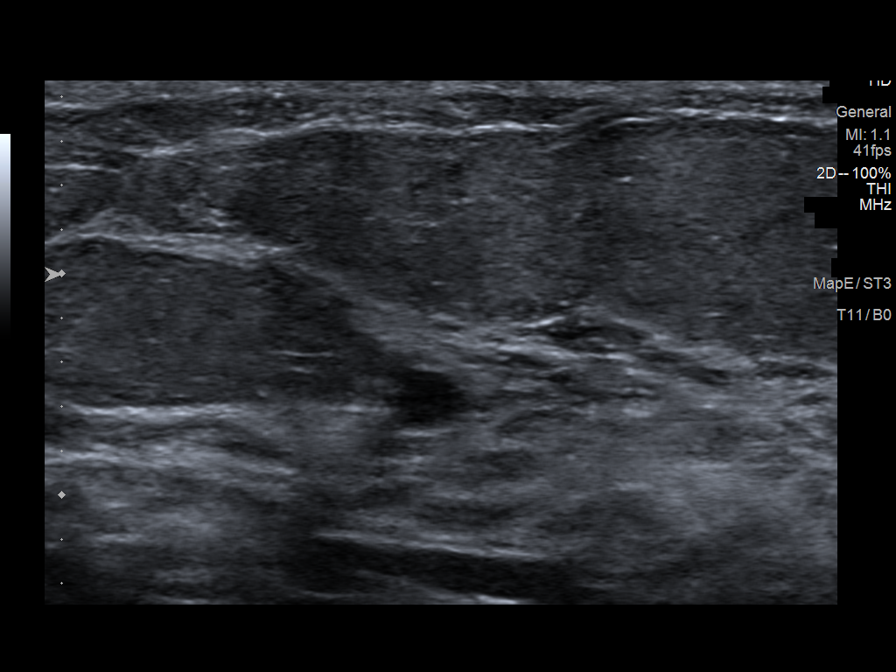
[im 5/9]
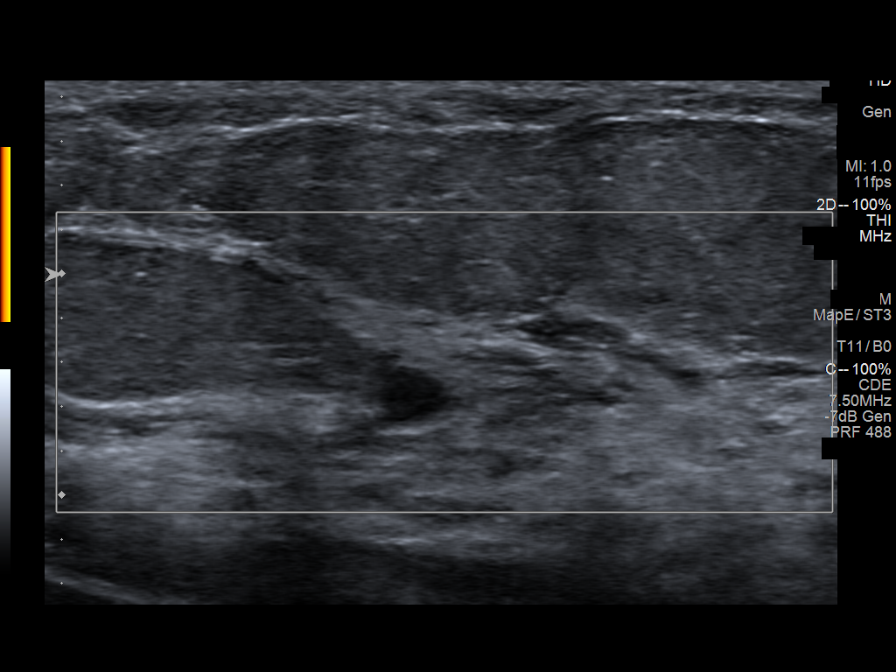
[im 6/9]
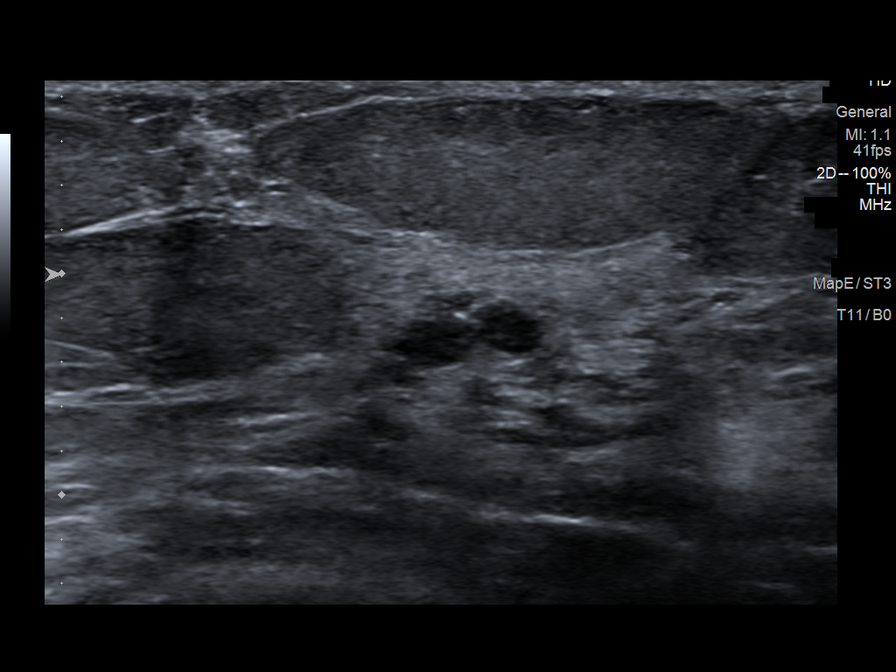
[im 7/9]
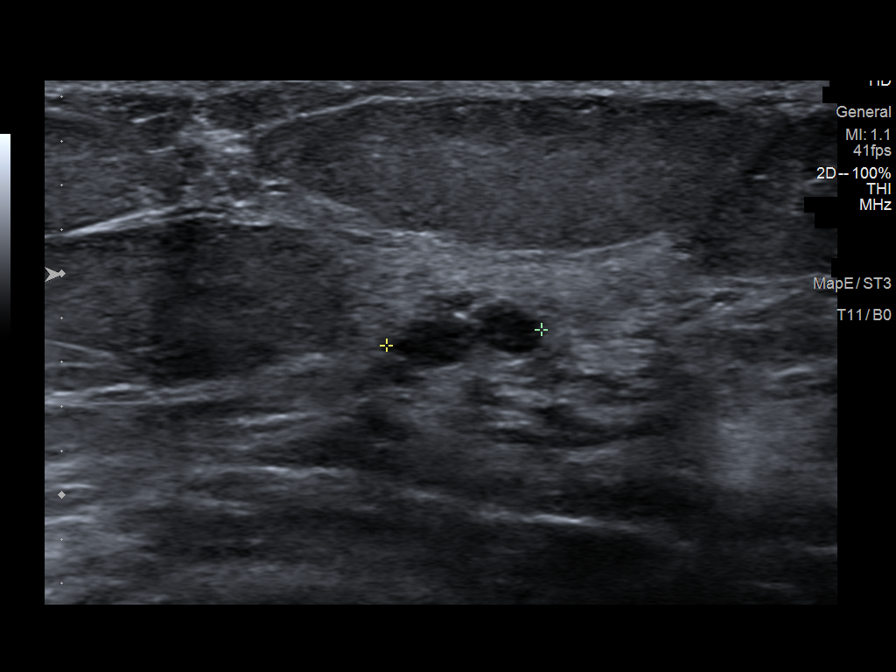
[im 8/9]
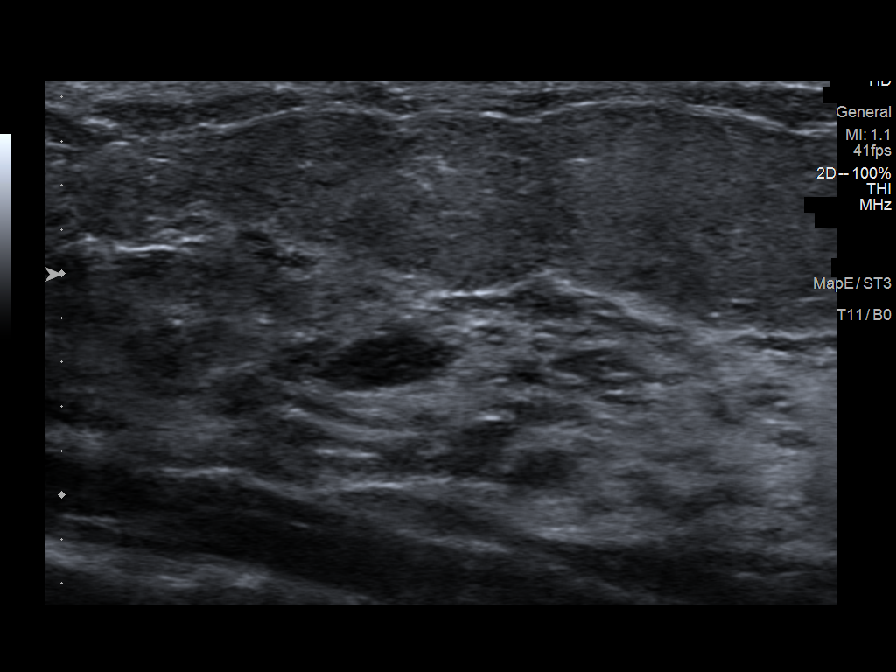
[im 9/9]
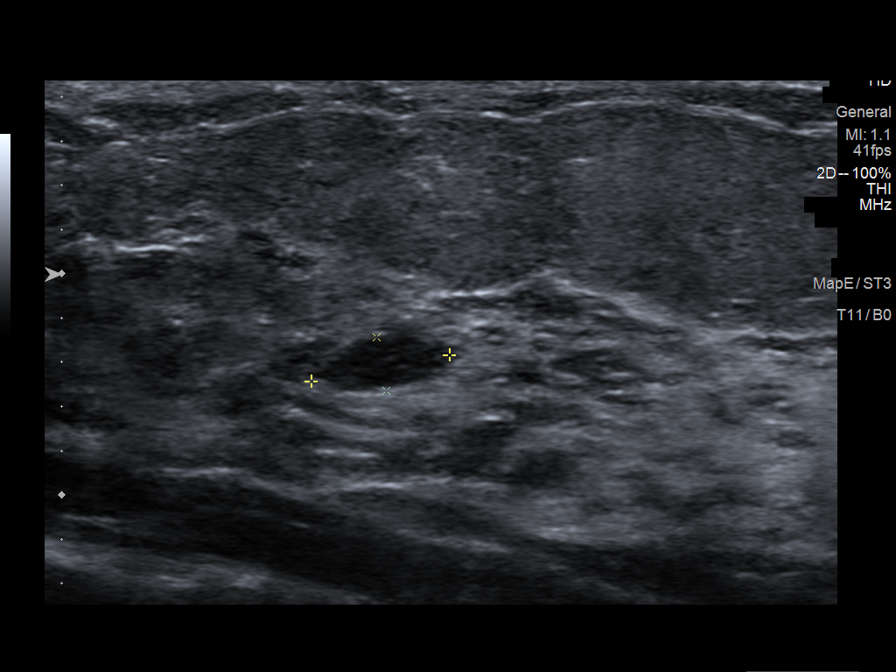

[9 of 9 positions shown; findings below may reference images not displayed]

ACR Breast Density Category c: The breast tissue is heterogeneously
dense, which may obscure small masses.
FINDINGS: CC and MLO views of both breasts and a spot tangential view of the
area of concern in the LEFT breast were obtained.

RIGHT: No findings suspicious for malignancy.

LEFT: Superficial island of fibroglandular tissue in the lower inner
breast in the area of focal pain. Deep to this island of tissue
there may be an obscured isodense mass measuring approximately 6 mm
without associated architectural distortion or suspicious
calcifications.

Targeted ultrasound is performed, showing normal fibroglandular
tissue at the 7 o'clock position approximately 4 cm from the nipple
in the area of focal pain.

At the 10 o'clock position approximately 2 cm from the nipple within
dense fibroglandular tissue is a circumscribed oval parallel nearly
anechoic mass with internal echoes measuring approximately 7 x 3 x 6
mm, demonstrating slight posterior acoustic enhancement and no
internal power Doppler flow, accounting for the obscured mass on
mammography.
IMPRESSION: 1. Likely benign 7 mm complicated cyst in the UPPER INNER QUADRANT
of the LEFT breast at the 10 o'clock position approximately 2 cm
from the nipple.
2. No mammographic evidence of malignancy involving the RIGHT
breast.

RECOMMENDATION:
LEFT breast ultrasound in 6 months.

Strategies for alleviating breast pain including decreasing caffeine
intake and vitamin-E supplementation were discussed with the
patient.

I have discussed the findings and recommendations with the patient.
If applicable, a reminder letter will be sent to the patient
regarding the next appointment.

BI-RADS CATEGORY  3: Probably benign.

## 2022-05-23 ENCOUNTER — Other Ambulatory Visit: Payer: Self-pay | Admitting: Family

## 2022-08-11 ENCOUNTER — Other Ambulatory Visit: Payer: Self-pay | Admitting: Family

## 2022-09-11 ENCOUNTER — Ambulatory Visit: Admitting: Family

## 2022-09-11 ENCOUNTER — Encounter: Payer: Self-pay | Admitting: Family

## 2022-09-11 VITALS — BP 100/88 | HR 94 | Temp 98.1°F | Resp 18 | Ht 66.0 in | Wt 196.2 lb

## 2022-09-11 DIAGNOSIS — R238 Other skin changes: Secondary | ICD-10-CM

## 2022-09-11 DIAGNOSIS — F419 Anxiety disorder, unspecified: Secondary | ICD-10-CM

## 2022-09-11 DIAGNOSIS — F32A Depression, unspecified: Secondary | ICD-10-CM | POA: Diagnosis not present

## 2022-09-11 NOTE — Progress Notes (Signed)
Subjective:   By signing my name below, I, Marlana Latus, attest that this documentation has been prepared under the direction and in the presence of Nance Pear, NP 09/11/22   Patient ID: Ruth Mccarthy, female    DOB: [birth year], 37 y.o.   MRN: [id number]  Chief Complaint  Patient presents with   Anxiety   Depression   Follow-up    HPI Patient is in today for a 6 month follow up.   Dry Scalp: She continues to have a dry scalp. She reports she went to dermatologist and was told it was a staph infection. She has completed two rounds of medication and it has not improved. She has tried Selsun blue shampoo and tea tree oils.   Mood: She is compliant with Prozac.   Appetite: She notes that she has had an increased appetite recently.   Immunizations: She has not had the flu vaccine for this season and does not want to receive it today.   Exercise: She has been trying to exercise more often. She got a treadmill in January and reports having used it about 4 times in the last month.   Periods: She reports she is having regular periods.   Past Medical History:  Diagnosis Date   Broken finger    No pertinent past medical history    Spontaneous vaginal delivery 12/23/2018   SVD (spontaneous vaginal delivery) 01/08/2014    Past Surgical History:  Procedure Laterality Date   EYE SURGERY     lasix   NO PAST SURGERIES     WISDOM TOOTH EXTRACTION      Family History  Problem Relation Age of Onset   Alcohol abuse Mother    Cancer Mother        cervix   Hypertension Mother    Alcohol abuse Maternal Uncle    Hyperlipidemia Maternal Grandmother    Vision loss Maternal Grandmother    Colon polyps Maternal Grandmother    Diabetes Maternal Grandfather    Hypertension Maternal Grandfather    Stroke Maternal Grandfather    Anesthesia problems Neg Hx     Social History   Socioeconomic History   Marital status: Married    Spouse name: Not on file   Number of children:  3   Years of education: Not on file   Highest education level: Not on file  Occupational History   Occupation: Pharmacist, hospital  Tobacco Use   Smoking status: Never   Smokeless tobacco: Never  Vaping Use   Vaping Use: Never used  Substance and Sexual Activity   Alcohol use: No   Drug use: No   Sexual activity: Yes    Partners: Male    Birth control/protection: Condom  Other Topics Concern   Not on file  Social History Narrative   Daughter- 2013 Warren Lacy   [id number] [place] [employer]   Daughter-2020 Minette Brine   Married   Works as a Clinical research associate   Completed bachelors   Dog ([place]- Pharmacologist)   Enjoys playing games on her phone   In-laws are local   Husband is in [employer]- deployed.    Social Determinants of Health   Financial Resource Strain: Low Risk  (12/15/2018)   Overall Financial Resource Strain (CARDIA)    Difficulty of Paying Living Expenses: Not hard at all  Food Insecurity: No Food Insecurity (12/15/2018)   Hunger Vital Sign    Worried About Running Out of Food in the Last Year: Never true  Ran Out of Food in the Last Year: Never true  Transportation Needs: Unknown (12/15/2018)   PRAPARE - Hydrologist (Medical): No    Lack of Transportation (Non-Medical): Not on file  Physical Activity: Insufficiently Active (01/25/2020)   Exercise Vital Sign    Days of Exercise per Week: 1 day    Minutes of Exercise per Session: 60 min  Stress: No Stress Concern Present (12/15/2018)   [place]    Feeling of Stress : Only a little  Social Connections: Not on file  Intimate Partner Violence: Not At Risk (12/15/2018)   Humiliation, Afraid, Rape, and Kick questionnaire    Fear of Current or Ex-Partner: No    Emotionally Abused: No    Physically Abused: No    Sexually Abused: No    Outpatient Medications Prior to Visit  Medication Sig Dispense Refill   FLUoxetine (PROZAC) 20 MG capsule  TAKE 1 CAPSULE(20 MG) BY MOUTH DAILY 90 capsule 1   hydrOXYzine (VISTARIL) 25 MG capsule TAKE 1 CAPSULE(25 MG) BY MOUTH EVERY 8 HOURS AS NEEDED 30 capsule 0   phentermine 30 MG capsule Take 30 mg by mouth daily.     fluocinonide (LIDEX) 0.05 % external solution Apply to scalp several times a week as needed for itching 60 mL 1   No facility-administered medications prior to visit.    Allergies  Allergen Reactions   Kiwi Extract Itching   Shrimp [Shellfish Allergy] Diarrhea and Nausea And Vomiting    Review of Systems  Constitutional:        (+) increased appetite   Skin:        (+) dry scalp        Objective:    Physical Exam Constitutional:      General: She is not in acute distress.    Appearance: Normal appearance. She is well-developed.  HENT:     Head: Normocephalic and atraumatic.     Right Ear: External ear normal.     Left Ear: External ear normal.  Eyes:     General: No scleral icterus. Neck:     Thyroid: No thyromegaly.  Cardiovascular:     Rate and Rhythm: Normal rate and regular rhythm.     Heart sounds: Normal heart sounds. No murmur heard. Pulmonary:     Effort: Pulmonary effort is normal. No respiratory distress.     Breath sounds: Normal breath sounds. No wheezing.  Musculoskeletal:     Cervical back: Neck supple.  Skin:    General: Skin is warm and dry.  Neurological:     Mental Status: She is alert and oriented to person, place, and time.  Psychiatric:        Mood and Affect: Mood normal.        Behavior: Behavior normal.        Thought Content: Thought content normal.        Judgment: Judgment normal.     BP 100/88 (BP Location: Left Arm, Patient Position: Sitting, Cuff Size: Normal)   Pulse 94   Temp 98.1 F (36.7 C) (Oral)   Resp 18   Ht '5\' 6"'$  (1.676 m)   Wt 196 lb 3.2 oz (89 kg)   SpO2 98%   BMI 31.67 kg/m  Wt Readings from Last 3 Encounters:  09/11/22 196 lb 3.2 oz (89 kg)  03/13/22 197 lb (89.4 kg)  12/11/21 193 lb (87.5  kg)  Assessment & Plan:  Dry scalp Assessment & Plan: This continues to bother her. No improvement with measures thus far. Advised pt to keep her upcoming appointment with dermatology for further discussion.    Anxiety and depression Assessment & Plan: Patient reports that her mood remains stable on prozac '20mg'$  once daily. Will continue same.       I,Rachel Rivera,acting as a Education administrator for Nance Pear, NP.,have documented all relevant documentation on the behalf of Nance Pear, NP,as directed by  Nance Pear, NP while in the presence of Nance Pear, NP.   I, Nance Pear, NP, personally preformed the services described in this documentation.  All medical record entries made by the scribe were at my direction and in my presence.  I have reviewed the chart and discharge instructions (if applicable) and agree that the record reflects my personal performance and is accurate and complete. 09/11/22   Nance Pear, NP

## 2022-09-11 NOTE — Assessment & Plan Note (Addendum)
Patient reports that her mood remains stable on prozac '20mg'$  once daily. Will continue same.

## 2022-09-11 NOTE — Assessment & Plan Note (Signed)
This continues to bother her. No improvement with measures thus far. Advised pt to keep her upcoming appointment with dermatology for further discussion.

## 2022-11-13 ENCOUNTER — Other Ambulatory Visit: Payer: Self-pay | Admitting: Family

## 2022-11-13 ENCOUNTER — Ambulatory Visit: Admitting: Family

## 2022-11-13 VITALS — BP 118/74 | HR 74 | Temp 98.3°F | Resp 16 | Wt 201.0 lb

## 2022-11-13 DIAGNOSIS — M67432 Ganglion, left wrist: Secondary | ICD-10-CM | POA: Insufficient documentation

## 2022-11-13 DIAGNOSIS — M6740 Ganglion, unspecified site: Secondary | ICD-10-CM | POA: Insufficient documentation

## 2022-11-13 NOTE — Progress Notes (Signed)
Subjective:   By signing my name below, I, Ruth Mccarthy, attest that this documentation has been prepared under the direction and in the presence of Ruth Fillers, NP 11/13/22   Patient ID: Ruth Mccarthy, female    DOB: 09-30-1985, 37 y.o.   MRN: [id number]  Chief Complaint  Patient presents with   Cyst    Patient reports a knot on left wrist, first noticed knot last week but it has been hurting for several weeks    HPI Patient is in today for an office visit.   Ganglion Cyst:  She complains of bilateral wrist pain for several weeks and a cyst on left wrist last week. She first noticed the cyst on her left wrist after trying to get off of the floor and she was unable to put any weight on her left hand. She also endorses swelling and numbness in her left fingers. Denies any known injuries.   Past Medical History:  Diagnosis Date   Broken finger    No pertinent past medical history    Spontaneous vaginal delivery 12/23/2018   SVD (spontaneous vaginal delivery) 01/08/2014    Past Surgical History:  Procedure Laterality Date   EYE SURGERY     lasix   NO PAST SURGERIES     WISDOM TOOTH EXTRACTION      Family History  Problem Relation Age of Onset   Alcohol abuse Mother    Cancer Mother        cervix   Hypertension Mother    Alcohol abuse Maternal Uncle    Hyperlipidemia Maternal Grandmother    Vision loss Maternal Grandmother    Colon polyps Maternal Grandmother    Diabetes Maternal Grandfather    Hypertension Maternal Grandfather    Stroke Maternal Grandfather    Anesthesia problems Neg Hx     Social History   Socioeconomic History   Marital status: Married    Spouse name: Not on file   Number of children: 3   Years of education: Not on file   Highest education level: Bachelor's degree (e.g., BA, AB, BS)  Occupational History   Occupation: Runner, broadcasting/film/video  Tobacco Use   Smoking status: Never   Smokeless tobacco: Never  Vaping Use   Vaping Use: Never used   Substance and Sexual Activity   Alcohol use: No   Drug use: No   Sexual activity: Yes    Partners: Male    Birth control/protection: Condom  Other Topics Concern   Not on file  Social History Narrative   Daughter- 2013 Shea Evans   [id number]   Daughter-2020 Zollie Scale   Married   Works as a Therapist, sports   Completed bachelors   Dog (Morkie- Cytogeneticist)   Enjoys playing games on her phone   In-laws are local   Husband is in [employer]- deployed.    Social Determinants of Health   Financial Resource Strain: Low Risk  (11/12/2022)   Overall Financial Resource Strain (CARDIA)    Difficulty of Paying Living Expenses: Not hard at all  Food Insecurity: No Food Insecurity (11/12/2022)   Hunger Vital Sign    Worried About Running Out of Food in the Last Year: Never true    Ran Out of Food in the Last Year: Never true  Transportation Needs: No Transportation Needs (11/12/2022)   PRAPARE - Administrator, Civil Service (Medical): No    Lack of Transportation (Non-Medical): No  Physical Activity: Unknown (11/12/2022)  Exercise Vital Sign    Days of Exercise per Week: 2 days    Minutes of Exercise per Session: Not on file  Stress: No Stress Concern Present (11/12/2022)   [employer] of Occupational Health - Occupational Stress Questionnaire    Feeling of Stress : Only a little  Social Connections: Moderately Integrated (11/12/2022)   Social Connection and Isolation Panel [NHANES]    Frequency of Communication with Friends and Family: More than three times a week    Frequency of Social Gatherings with Friends and Family: Once a week    Attends Religious Services: More than 4 times per year    Active Member of [employer] or Organizations: No    Attends Banker Meetings: Not on file    Marital Status: Married  Catering manager Violence: Not At Risk (12/15/2018)   Humiliation, Afraid, Rape, and Kick questionnaire    Fear of Current or Ex-Partner: No     Emotionally Abused: No    Physically Abused: No    Sexually Abused: No    Outpatient Medications Prior to Visit  Medication Sig Dispense Refill   FLUoxetine (PROZAC) 20 MG capsule TAKE 1 CAPSULE(20 MG) BY MOUTH DAILY 90 capsule 1   hydrOXYzine (VISTARIL) 25 MG capsule TAKE 1 CAPSULE(25 MG) BY MOUTH EVERY 8 HOURS AS NEEDED 30 capsule 0   phentermine 30 MG capsule Take 30 mg by mouth daily.     No facility-administered medications prior to visit.    Allergies  Allergen Reactions   Kiwi Extract Itching   Shrimp [Shellfish Allergy] Diarrhea and Nausea And Vomiting    Review of Systems  Musculoskeletal:  Positive for joint pain (wrist - see HPI).       Objective:    Physical Exam Constitutional:      Appearance: Normal appearance.  Cardiovascular:     Rate and Rhythm: Normal rate.  Pulmonary:     Effort: Pulmonary effort is normal.  Musculoskeletal:     Comments: Ganglion cyst on left wrist  Skin:    General: Skin is warm and dry.  Neurological:     Mental Status: She is alert and oriented to person, place, and time.  Psychiatric:        Mood and Affect: Mood normal.        Thought Content: Thought content normal.        Judgment: Judgment normal.     BP 118/74 (BP Location: Right Arm, Patient Position: Sitting, Cuff Size: Small)   Pulse 74   Temp 98.3 F (36.8 C) (Oral)   Resp 16   Wt 201 lb (91.2 kg)   SpO2 100%   BMI 32.44 kg/m  Wt Readings from Last 3 Encounters:  11/13/22 201 lb (91.2 kg)  09/11/22 196 lb 3.2 oz (89 kg)  03/13/22 197 lb (89.4 kg)       Assessment & Plan:  Ganglion cyst Assessment & Plan: New, desires to have it drained. Will refer to sports medicine.   Orders: -     Ambulatory referral to Sports Medicine     I,Ruth Mccarthy,acting as a scribe for Ruth Fillers, NP.,have documented all relevant documentation on the behalf of Ruth Fillers, NP,as directed by  Ruth Fillers, NP while in the presence of  Ruth Fillers, NP.   I, Ruth Fillers, NP, personally preformed the services described in this documentation.  All medical record entries made by the scribe were at my direction and in my presence.  I have reviewed the chart and discharge instructions (if applicable) and agree that the record reflects my personal performance and is accurate and complete. 11/13/22   Ruth Fillers, NP

## 2022-11-13 NOTE — Assessment & Plan Note (Addendum)
New, desires to have it drained. Will refer to sports medicine.

## 2022-11-15 ENCOUNTER — Ambulatory Visit: Admitting: Family Medicine

## 2022-11-15 ENCOUNTER — Other Ambulatory Visit: Payer: Self-pay

## 2022-11-15 ENCOUNTER — Encounter: Payer: Self-pay | Admitting: Family Medicine

## 2022-11-15 VITALS — BP 108/70 | Ht 66.0 in | Wt 201.0 lb

## 2022-11-15 DIAGNOSIS — M67432 Ganglion, left wrist: Secondary | ICD-10-CM

## 2022-11-15 NOTE — Assessment & Plan Note (Signed)
Acutely occurring.  Cyst appreciated on ultrasound.   - Counseled on home exercise therapy and supportive care. -Aspiration today.

## 2022-11-15 NOTE — Patient Instructions (Signed)
Nice to meet you Please use ice as needed   Please send me a message in MyChart with any questions or updates.  Please see me back as needed.   --Dr. Canyon Willow  

## 2022-11-15 NOTE — Progress Notes (Signed)
  CULA JASS - 37 y.o. female MRN [id number]  Date of birth: April 26, 1986  SUBJECTIVE:  Including CC & ROS.  No chief complaint on file.   CHRISTYNA FENDERSON is a 37 y.o. female that is  presenting with ganglion cyst on left wrist. Acutely occurring and causing pain. No injury.    Review of Systems See HPI   HISTORY: Past Medical, Surgical, Social, and Family History Reviewed & Updated per EMR.   Pertinent Historical Findings include:  Past Medical History:  Diagnosis Date   Broken finger    No pertinent past medical history    Spontaneous vaginal delivery 12/23/2018   SVD (spontaneous vaginal delivery) 01/08/2014    Past Surgical History:  Procedure Laterality Date   EYE SURGERY     lasix   NO PAST SURGERIES     WISDOM TOOTH EXTRACTION       PHYSICAL EXAM:  VS: BP 108/70 (BP Location: Left Arm, Patient Position: Sitting)   Ht 5\' 6"  (1.676 m)   Wt 201 lb (91.2 kg)   BMI 32.44 kg/m  Physical Exam Gen: NAD, alert, cooperative with exam, well-appearing MSK:  Neurovascularly intact    Limited ultrasound: Left pain:  Cyst appreciated over the dorsum of the wrist  Summary: Findings consistent with ganglion cyst  Ultrasound and interpretation by Clare Gandy, MD   Aspiration/Injection Procedure Note Coralee Rud Jul 30, 1985  Procedure: Injection Indications: Left wrist pain  Procedure Details Consent: Risks of procedure as well as the alternatives and risks of each were explained to the (patient/caregiver).  Consent for procedure obtained. Time Out: Verified patient identification, verified procedure, site/side was marked, verified correct patient position, special equipment/implants available, medications/allergies/relevent history reviewed, required imaging and test results available.  Performed.  The area was cleaned with iodine and alcohol swabs.    The left ganglion cyst was injected using 3 cc of 1% lidocaine plain 0.3 cc of 8.4% sodium bicarbonate on a  25-gauge 1-1/2 inch needle.  An 18-gauge 1-1/2 inch needle was used to achieve aspiration.  Ultrasound was used. Images were obtained in long views showing the injection.    Amount of Fluid Aspirated: minimal amount Character of Fluid: red colored and gelatinous Fluid was sent for: n/a  A sterile dressing was applied.  Patient did tolerate procedure well.    ASSESSMENT & PLAN:   Ganglion cyst of dorsum of left wrist Acutely occurring.  Cyst appreciated on ultrasound.   - Counseled on home exercise therapy and supportive care. -Aspiration today.

## 2022-12-10 ENCOUNTER — Encounter: Payer: Self-pay | Admitting: Family

## 2023-02-02 ENCOUNTER — Encounter (INDEPENDENT_AMBULATORY_CARE_PROVIDER_SITE_OTHER): Admitting: Family

## 2023-02-02 DIAGNOSIS — Z91030 Bee allergy status: Secondary | ICD-10-CM

## 2023-02-04 DIAGNOSIS — Z91030 Bee allergy status: Secondary | ICD-10-CM

## 2023-02-04 DIAGNOSIS — T63461A Toxic effect of venom of wasps, accidental (unintentional), initial encounter: Secondary | ICD-10-CM

## 2023-02-04 MED ORDER — EPINEPHRINE 0.3 MG/0.3ML IJ SOAJ: 0.3000 mg | INTRAMUSCULAR | 1 refills | Status: DC | PRN

## 2023-02-04 NOTE — Telephone Encounter (Signed)
Please see the MyChart message reply(ies) for my assessment and plan.  The patient gave consent for this Medical Advice Message and is aware that it may result in a bill to their insurance company as well as the possibility that this may result in a co-payment or deductible. They are an established patient, but are not seeking medical advice exclusively about a problem treated during an in person or video visit in the last 7 days. I did not recommend an in person or video visit within 7 days of my reply.  I spent a total of 5 minutes cumulative provider time within 7 days through [employer].  Nance Pear, NP

## 2023-02-21 ENCOUNTER — Encounter (INDEPENDENT_AMBULATORY_CARE_PROVIDER_SITE_OTHER): Payer: Self-pay

## 2023-03-15 ENCOUNTER — Ambulatory Visit (INDEPENDENT_AMBULATORY_CARE_PROVIDER_SITE_OTHER): Admitting: Family

## 2023-03-15 ENCOUNTER — Encounter: Payer: Self-pay | Admitting: Family

## 2023-03-15 VITALS — BP 102/67 | HR 68 | Temp 98.5°F | Resp 16 | Ht 67.0 in | Wt 207.2 lb

## 2023-03-15 DIAGNOSIS — R238 Other skin changes: Secondary | ICD-10-CM

## 2023-03-15 DIAGNOSIS — Z0000 Encounter for general adult medical examination without abnormal findings: Secondary | ICD-10-CM

## 2023-03-15 DIAGNOSIS — M722 Plantar fascial fibromatosis: Secondary | ICD-10-CM | POA: Diagnosis not present

## 2023-03-15 DIAGNOSIS — F32A Depression, unspecified: Secondary | ICD-10-CM

## 2023-03-15 DIAGNOSIS — Z91030 Bee allergy status: Secondary | ICD-10-CM | POA: Diagnosis not present

## 2023-03-15 DIAGNOSIS — Z23 Encounter for immunization: Secondary | ICD-10-CM

## 2023-03-15 DIAGNOSIS — E785 Hyperlipidemia, unspecified: Secondary | ICD-10-CM | POA: Diagnosis not present

## 2023-03-15 DIAGNOSIS — Z1159 Encounter for screening for other viral diseases: Secondary | ICD-10-CM

## 2023-03-15 DIAGNOSIS — F419 Anxiety disorder, unspecified: Secondary | ICD-10-CM

## 2023-03-15 LAB — COMPREHENSIVE METABOLIC PANEL
ALT: 12 U/L (ref 0–35)
AST: 13 U/L (ref 0–37)
Albumin: 4.4 g/dL (ref 3.5–5.2)
Alkaline Phosphatase: 62 U/L (ref 39–117)
BUN: 11 mg/dL (ref 6–23)
CO2: 28 meq/L (ref 19–32)
Calcium: 9.7 mg/dL (ref 8.4–10.5)
Chloride: 104 meq/L (ref 96–112)
Creatinine, Ser: 0.74 mg/dL (ref 0.40–1.20)
GFR: 103.23 mL/min (ref >60.00)
Glucose, Bld: 72 mg/dL (ref 70–99)
Potassium: 4.4 meq/L (ref 3.5–5.1)
Sodium: 139 meq/L (ref 135–145)
Total Bilirubin: 0.5 mg/dL (ref 0.2–1.2)
Total Protein: 7.1 g/dL (ref 6.0–8.3)

## 2023-03-15 LAB — LIPID PANEL
Cholesterol: 210 mg/dL — ABNORMAL HIGH (ref 0–200)
HDL: 64.5 mg/dL (ref >39.00)
LDL Cholesterol: 113 mg/dL — ABNORMAL HIGH (ref 0–99)
NonHDL: 145.71
Total CHOL/HDL Ratio: 3
Triglycerides: 165 mg/dL — ABNORMAL HIGH (ref 0.0–149.0)
VLDL: 33 mg/dL (ref 0.0–40.0)

## 2023-03-15 NOTE — Assessment & Plan Note (Signed)
  Persistent scalp inflammation despite previous treatment with two topical solutions. Per pt dermatology told her it was due to a staph infection.  -Request patient to provide names of previous medications via my chart. -Consider refilling prescriptions if appropriate. -Recommend follow-up with dermatology if no improvement.

## 2023-03-15 NOTE — Assessment & Plan Note (Signed)
  Left foot pain for two months, exacerbated by running. No improvement with shoe inserts. -Advise on wearing supportive shoes and avoiding flip flops. -Recommend ibuprofen twice daily for a week. -Suggest rolling foot on a frozen water bottle for inflammation. -Provided pt with exercises for plantar fasciitis. -Consider referral to podiatry if no improvement after a month.

## 2023-03-15 NOTE — Assessment & Plan Note (Signed)
Stable on prozac. Continue same.  °

## 2023-03-15 NOTE — Progress Notes (Signed)
Subjective:     Patient ID: Ruth Mccarthy, female    DOB: 10/02/1985, 37 y.o.   MRN: [id number]  Chief Complaint  Patient presents with   Annual Exam    HPI  Discussed the use of AI scribe software for clinical note transcription with the patient, who gave verbal consent to proceed.  History of Present Illness   The patient, a Geologist, engineering, presents with left foot plantar fasciitis diagnosed by another provider. She has been experiencing this discomfort for approximately two months. She has not yet tried ibuprofen, cold therapy, or specific exercises for this condition. The patient also reports a history of running, which she believes may have contributed to the onset of her symptoms.  In addition to the plantar fasciitis, the patient has a persistent scalp condition diagnosed as a staph infection by a dermatologist. She was previously treated with two topical solutions, one for the infection and one for itching. However, the condition has not completely resolved and is currently scattered across the scalp.  The patient also reports a popping sensation in her left ankle, which is not associated with pain. 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 She is currently taking Prozac, with good control of her mood.  The patient has a history of high cholesterol, and she reports a diet that could be improved. She has cut back on fast food and is considering incorporating more fruits and vegetables into her meals. She has been less active since the school year started, but she was running regularly over the summer.      Immunizations: 2020 Diet:  needs improvement Wt Readings from Last 3 Encounters:  03/15/23 207 lb 3.2 oz (94 kg)  11/15/22 201 lb (91.2 kg)  11/13/22 201 lb (91.2 kg)  Exercise: was running over the summer, has a treadmill Pap Smear: due 8/25- GYN [facility]: due Dental: due    Health Maintenance Due  Topic Date Due    Hepatitis C Screening  Never done   COVID-19 Vaccine (1 - 2023-24 season) Never done    Past Medical History:  Diagnosis Date   Broken finger    No pertinent past medical history    Sciatica, left side 03/14/2021   Spontaneous vaginal delivery 12/23/2018   SVD (spontaneous vaginal delivery) 01/08/2014    Past Surgical History:  Procedure Laterality Date   EYE SURGERY     lasix   NO PAST SURGERIES     WISDOM TOOTH EXTRACTION      Family History  Problem Relation Age of Onset   Alcohol abuse Mother    Cancer Mother        cervix   Hypertension Mother    Alcohol abuse Maternal Uncle    Hyperlipidemia Maternal Grandmother    Vision loss Maternal Grandmother    Colon polyps Maternal Grandmother    Diabetes Maternal Grandfather    Hypertension Maternal Grandfather    Stroke Maternal Grandfather    Anesthesia problems Neg Hx     Social History   Socioeconomic History   Marital status: Married    Spouse name: Not on file   Number of children: 3   Years of education: Not on file   Highest education level: Bachelor's degree (e.g., BA, AB, BS)  Occupational History   Occupation: teacher  Tobacco Use   Smoking status: Never   Smokeless tobacco: Never  Vaping Use   Vaping status: Never Used  Substance  and Sexual Activity   Alcohol use: No   Drug use: No   Sexual activity: Yes    Partners: Male    Birth control/protection: Condom  Other Topics Concern   Not on file  Social History Narrative   Daughter- 2013 Shea Evans   [id number]   Daughter-2020 Zollie Scale   Married   Works as a Therapist, sports   Completed bachelors   Dog (Morkie- Cytogeneticist)   Enjoys playing games on her phone   In-laws are local   Husband is in [employer]- deployed.    Social Determinants of Health   Financial Resource Strain: Low Risk  (11/12/2022)   Overall Financial Resource Strain (CARDIA)    Difficulty of Paying Living Expenses: Not hard at all  Food Insecurity: No  Food Insecurity (11/12/2022)   Hunger Vital Sign    Worried About Running Out of Food in the Last Year: Never true    Ran Out of Food in the Last Year: Never true  Transportation Needs: No Transportation Needs (11/12/2022)   PRAPARE - Administrator, Civil Service (Medical): No    Lack of Transportation (Non-Medical): No  Physical Activity: Unknown (11/12/2022)   Exercise Vital Sign    Days of Exercise per Week: 2 days    Minutes of Exercise per Session: Not on file  Stress: No Stress Concern Present (11/12/2022)   [employer] of Occupational Health - Occupational Stress Questionnaire    Feeling of Stress : Only a little  Social Connections: Moderately Integrated (11/12/2022)   Social Connection and Isolation Panel [NHANES]    Frequency of Communication with Friends and Family: More than three times a week    Frequency of Social Gatherings with Friends and Family: Once a week    Attends Religious Services: More than 4 times per year    Active Member of [employer] or Organizations: No    Attends Engineer, structural: Not on file    Marital Status: Married  Catering manager Violence: Not At Risk (12/15/2018)   Humiliation, Afraid, Rape, and Kick questionnaire    Fear of Current or Ex-Partner: No    Emotionally Abused: No    Physically Abused: No    Sexually Abused: No    Outpatient Medications Prior to Visit  Medication Sig Dispense Refill   EPINEPHrine (EPIPEN 2-PAK) 0.3 mg/0.3 mL IJ SOAJ injection Inject 0.3 mg into the muscle as needed for anaphylaxis. 1 each 1   FLUoxetine (PROZAC) 20 MG capsule TAKE 1 CAPSULE(20 MG) BY MOUTH DAILY 90 capsule 1   hydrOXYzine (VISTARIL) 25 MG capsule TAKE 1 CAPSULE(25 MG) BY MOUTH EVERY 8 HOURS AS NEEDED 30 capsule 0   No facility-administered medications prior to visit.    Allergies  Allergen Reactions   Bee Venom     Local swelling   Kiwi Extract Itching   Shrimp [Shellfish Allergy] Diarrhea and Nausea And Vomiting     Review of Systems  HENT:  Negative for congestion and hearing loss.   Eyes:  Positive for blurred vision.  Respiratory:  Negative for cough.   Gastrointestinal:  Negative for constipation and diarrhea.  Genitourinary:  Negative for dysuria and frequency.  Musculoskeletal:  Negative for joint pain and myalgias.  Skin:  Positive for rash (scalp).  Neurological:  Negative for headaches.  Psychiatric/Behavioral:         Anxiety/depression is stable.        Objective:    Physical Exam   BP 102/67 (  BP Location: Right Arm, Patient Position: Sitting, Cuff Size: Normal)   Pulse 68   Temp 98.5 F (36.9 C) (Oral)   Resp 16   Ht 5\' 7"  (1.702 m)   Wt 207 lb 3.2 oz (94 kg)   SpO2 100%   BMI 32.45 kg/m  Wt Readings from Last 3 Encounters:  03/15/23 207 lb 3.2 oz (94 kg)  11/15/22 201 lb (91.2 kg)  11/13/22 201 lb (91.2 kg)   Physical Exam  Constitutional: She is oriented to person, place, and time. She appears well-developed and well-nourished. No distress.  HENT:  Head: Normocephalic and atraumatic.  Right Ear: Tympanic membrane and ear canal normal.  Left Ear: Tympanic membrane and ear canal normal.  Mouth/Throat: Oropharynx is clear and moist.  Eyes: Pupils are equal, round, and reactive to light. No scleral icterus.  Neck: Normal range of motion. No thyromegaly present.  Cardiovascular: Normal rate and regular rhythm.   No murmur heard. Pulmonary/Chest: Effort normal and breath sounds normal. No respiratory distress. He has no wheezes. She has no rales. She exhibits no tenderness.  Abdominal: Soft. Bowel sounds are normal. She exhibits no distension and no mass. There is no tenderness. There is no rebound and no guarding.  Musculoskeletal: She exhibits no edema.  Lymphadenopathy:    She has no cervical adenopathy.  Neurological: She is alert and oriented to person, place, and time. She has normal patellar reflexes. She exhibits normal muscle tone. Coordination normal.   Skin: Skin is warm and dry. Some scabbed lesions on scalp noted.  Small area of hair loss on crown approximately 1 cm wide Psychiatric: She has a normal mood and affect. Her behavior is normal. Judgment and thought content normal.  Breast/pelvic: deferred        Assessment & Plan:       Assessment & Plan:   Problem List Items Addressed This Visit       Unprioritized   Preventative health care - Primary    -Administer influenza vaccine today. -Encourage COVID-19 vaccination at a local pharmacy. -Advise on maintaining a balanced diet and regular exercise. -Order Hepatitis C screening, cholesterol, kidney function, and liver function tests. - Pap up to date -discussed strategies to improve nutrition and encourage weight loss. Discussed importance of walking 30 minutes 5 days a week.       Plantar fasciitis, left     Left foot pain for two months, exacerbated by running. No improvement with shoe inserts. -Advise on wearing supportive shoes and avoiding flip flops. -Recommend ibuprofen twice daily for a week. -Suggest rolling foot on a frozen water bottle for inflammation. -Provided pt with exercises for plantar fasciitis. -Consider referral to podiatry if no improvement after a month.      Dry scalp     Persistent scalp inflammation despite previous treatment with two topical solutions. Per pt dermatology told her it was due to a staph infection.  -Request patient to provide names of previous medications via my chart. -Consider refilling prescriptions if appropriate. -Recommend follow-up with dermatology if no improvement.      Bee allergy status    Pt keeps up to date epipen.       Anxiety and depression    Stable on prozac. Continue same.       Other Visit Diagnoses     Encounter for hepatitis C screening test for low risk patient       Relevant Orders   Hepatitis C Antibody   Hyperlipidemia, unspecified hyperlipidemia type  Relevant Orders   Lipid  panel   Comp Met (CMET)   Needs flu shot       Relevant Orders   Flu vaccine trivalent PF, 6mos and older(Flulaval,Afluria,Fluarix,Fluzone) (Completed)       I have discontinued Nishika L. Boice's hydrOXYzine. I am also having her maintain her FLUoxetine and EPINEPHrine.  No orders of the defined types were placed in this encounter.

## 2023-03-15 NOTE — Assessment & Plan Note (Signed)
Pt keeps up to date epipen.

## 2023-03-15 NOTE — Patient Instructions (Signed)
VISIT SUMMARY:  During your visit, we discussed several health concerns including your left foot pain, persistent scalp condition, general health maintenance, allergies, contraception, vision, and a popping sensation in your left ankle. We have developed a plan to address each of these issues.  YOUR PLAN:  -PLANTAR FASCIITIS: This is a condition that causes pain in the bottom of your foot, especially near the heel. We recommend wearing supportive shoes, avoiding flip flops, taking ibuprofen twice daily for a week, rolling your foot on a frozen water bottle for inflammation, and doing specific exercises. If there's no improvement after a month, we may refer you to a podiatrist.  -SCALP DERMATITIS: This is a condition that causes inflammation on your scalp. We need to know the names of the previous medications you used. We may refill these prescriptions if appropriate, and recommend a follow-up with dermatology if there's no improvement.  -GENERAL HEALTH MAINTENANCE: We administered the influenza vaccine today and encourage you to get the COVID-19 vaccine at a local pharmacy. We also advise maintaining a balanced diet and regular exercise. We have ordered tests for Hepatitis C, cholesterol, kidney function, and liver function. Continue taking Prozac for mood stability.  -ALLERGIES: You have allergies to bees and shrimp. Continue to keep your EpiPen on hand.  -CONTRACEPTION: You are using condoms for birth control. Continue this method.  -VISION: You reported worsening vision. We advise scheduling an eye exam.  -ANKLE: You reported a popping sensation in your left ankle without pain. No intervention is needed at this time.  INSTRUCTIONS:  Please provide the names of the previous medications you used for your scalp condition via my chart. Also, remember to schedule an eye exam. Continue taking Prozac and keep your EpiPen on hand. Follow the advice given for your foot pain and scalp condition. If  there's no improvement in these conditions, we may need to refer you to specialists. We will review your test results when they are available.

## 2023-03-15 NOTE — Assessment & Plan Note (Signed)
-  Administer influenza vaccine today. -Encourage COVID-19 vaccination at a local pharmacy. -Advise on maintaining a balanced diet and regular exercise. -Order Hepatitis C screening, cholesterol, kidney function, and liver function tests. - Pap up to date -discussed strategies to improve nutrition and encourage weight loss. Discussed importance of walking 30 minutes 5 days a week.

## 2023-03-16 LAB — HEPATITIS C ANTIBODY: Hepatitis C Ab: NONREACTIVE

## 2023-03-22 ENCOUNTER — Encounter: Payer: Self-pay | Admitting: Family

## 2023-05-24 IMAGING — US US BREAST*L* LIMITED INC AXILLA
1 series · 6 of 6 positions shown · non-contrast
Comparison: Previous exam(s).

CLINICAL DATA: Twelve month follow-up of a probably benign left
breast mass

EXAM:
ULTRASOUND OF THE LEFT BREAST

[Series 1: us breast*left* limited inc axilla · 0.05mm/px · 6 of 6 slices shown]
[im 1/6]
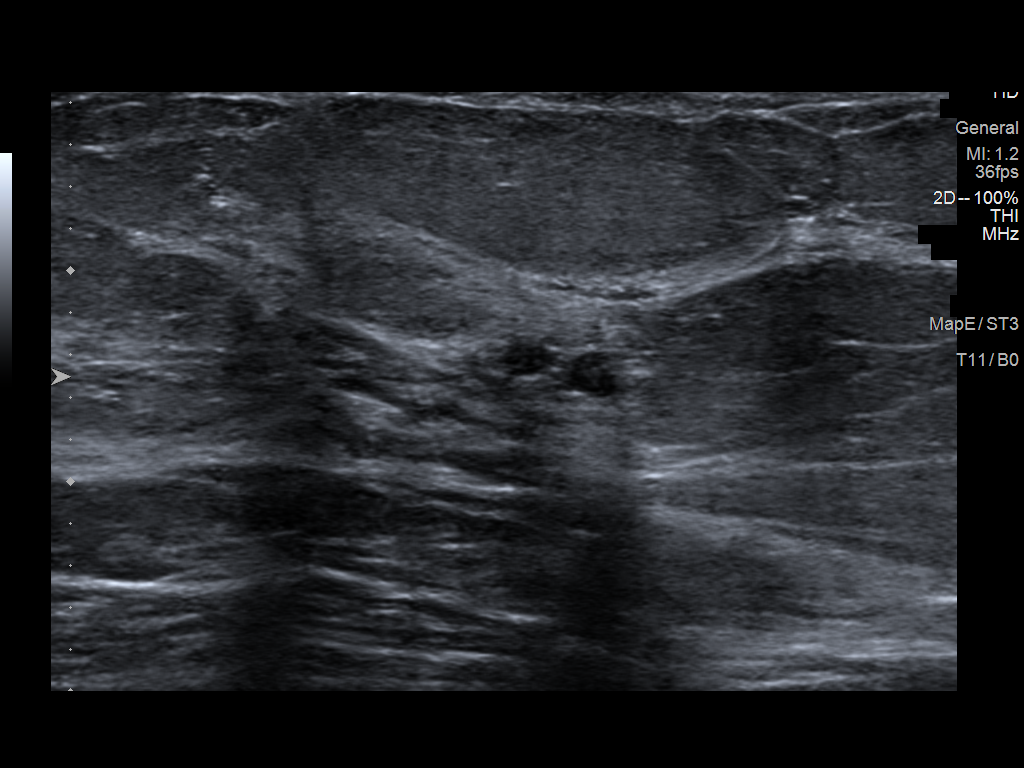
[im 2/6]
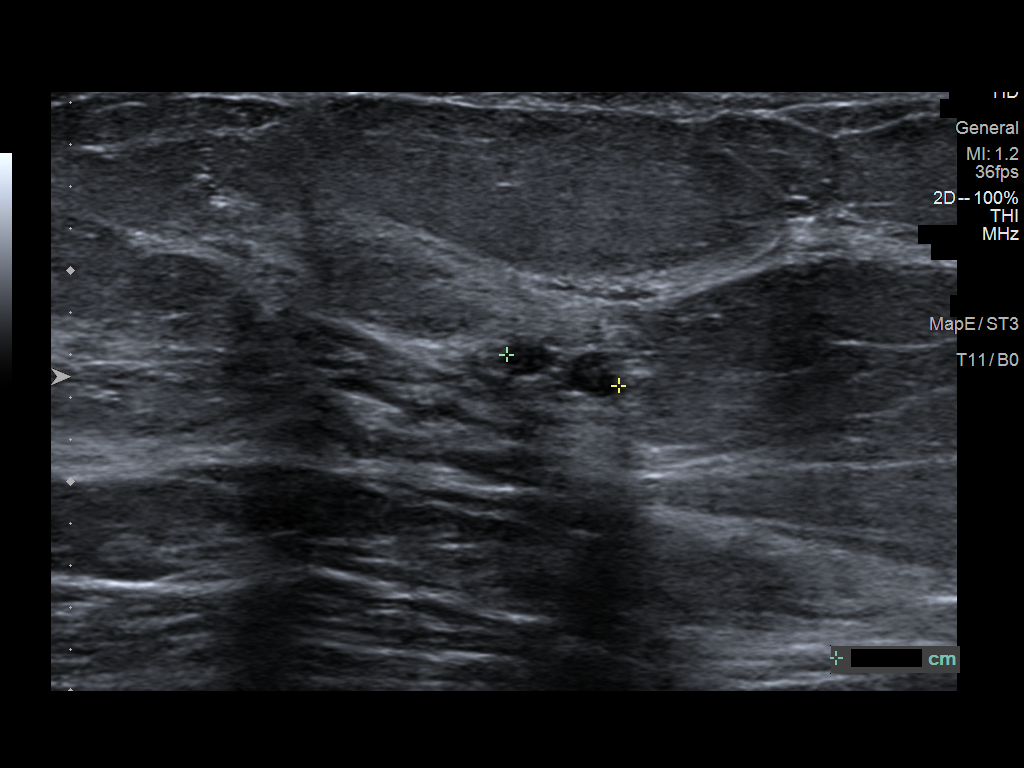
[im 3/6]
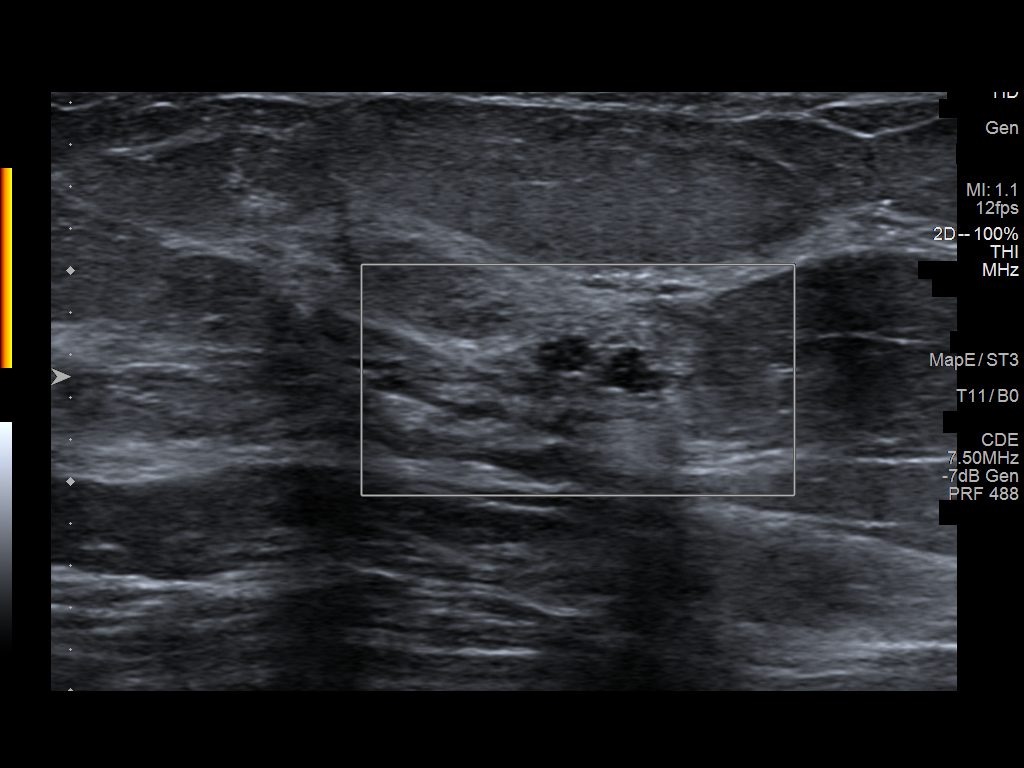
[im 4/6]
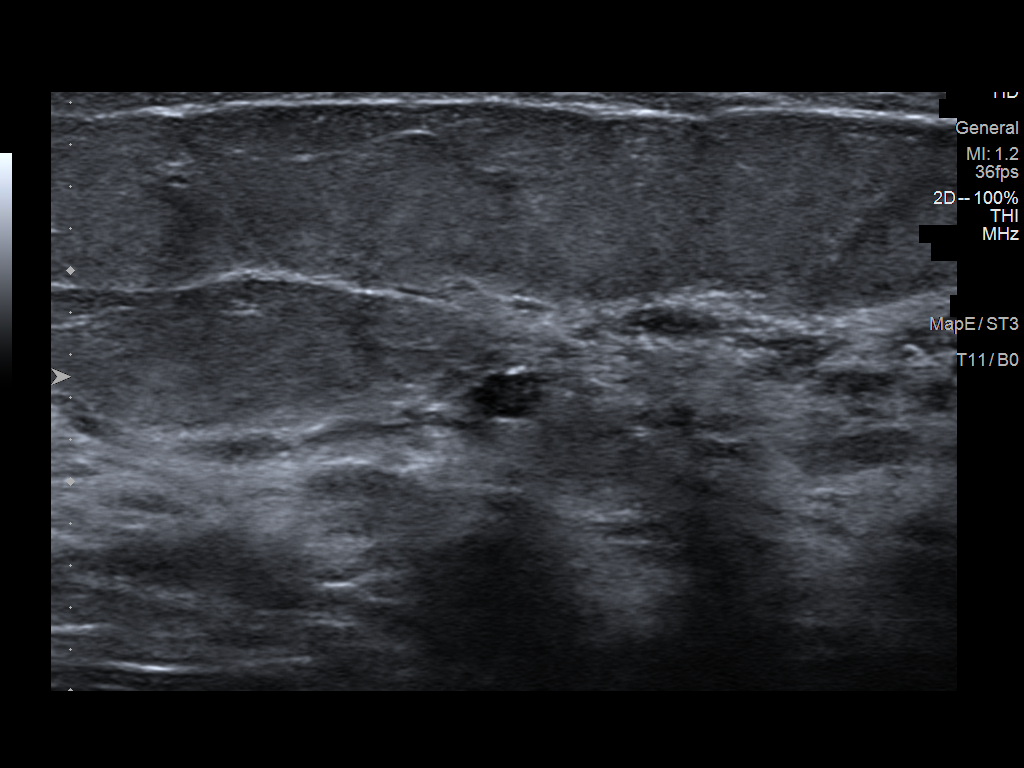
[im 5/6]
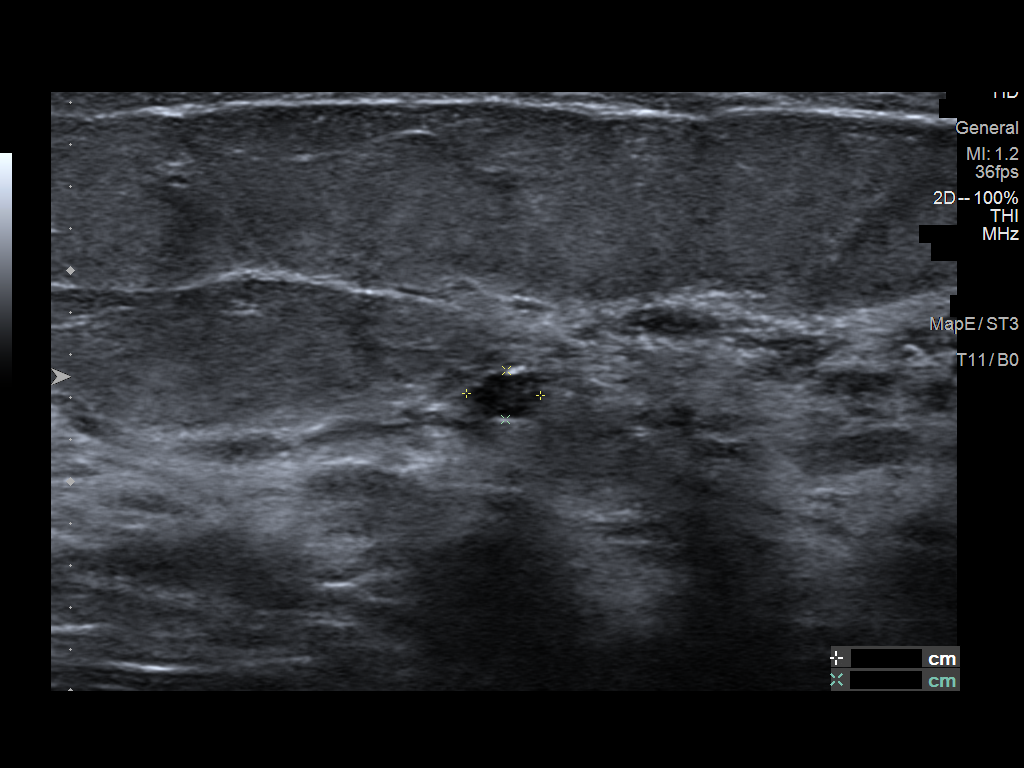
[im 6/6]
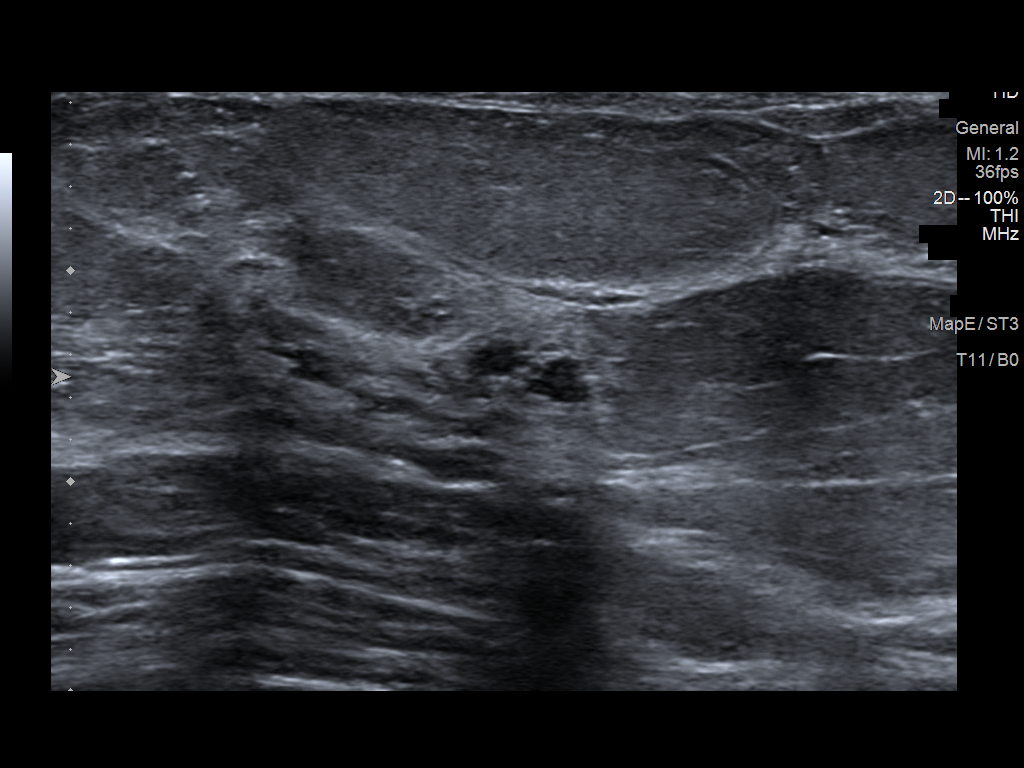

[6 of 6 positions shown; findings below may reference images not displayed]

FINDINGS: Targeted ultrasound is performed, showing interval decrease in size
the probably benign left breast mass. The mass now measures 5.5 by
2.3 x 3.5 cm today versus 7.0 by 2.5 x 6.4 mm in Sunday September, 2020.
IMPRESSION: Significant interval decrease in the size of the left breast mass,
now considered benign.

RECOMMENDATION:
Annual screening mammography beginning at the age of 40.

I have discussed the findings and recommendations with the patient.
If applicable, a reminder letter will be sent to the patient
regarding the next appointment.

BI-RADS CATEGORY  2: Benign.

## 2023-07-30 ENCOUNTER — Other Ambulatory Visit: Payer: Self-pay | Admitting: Family

## 2023-12-15 ENCOUNTER — Other Ambulatory Visit: Payer: Self-pay

## 2023-12-15 ENCOUNTER — Emergency Department (HOSPITAL_BASED_OUTPATIENT_CLINIC_OR_DEPARTMENT_OTHER)

## 2023-12-15 ENCOUNTER — Emergency Department (HOSPITAL_BASED_OUTPATIENT_CLINIC_OR_DEPARTMENT_OTHER)
Admission: EM | Admit: 2023-12-15 | Discharge: 2023-12-16 | Disposition: A | Discharge status: Home / Self Care | Attending: Emergency Medicine | Admitting: Emergency Medicine

## 2023-12-15 ENCOUNTER — Encounter (HOSPITAL_BASED_OUTPATIENT_CLINIC_OR_DEPARTMENT_OTHER): Payer: Self-pay

## 2023-12-15 DIAGNOSIS — E876 Hypokalemia: Secondary | ICD-10-CM | POA: Diagnosis not present

## 2023-12-15 DIAGNOSIS — D72829 Elevated white blood cell count, unspecified: Secondary | ICD-10-CM | POA: Insufficient documentation

## 2023-12-15 DIAGNOSIS — R109 Unspecified abdominal pain: Secondary | ICD-10-CM | POA: Diagnosis not present

## 2023-12-15 DIAGNOSIS — R197 Diarrhea, unspecified: Secondary | ICD-10-CM | POA: Diagnosis present

## 2023-12-15 DIAGNOSIS — R252 Cramp and spasm: Secondary | ICD-10-CM | POA: Diagnosis not present

## 2023-12-15 DIAGNOSIS — R112 Nausea with vomiting, unspecified: Secondary | ICD-10-CM | POA: Diagnosis not present

## 2023-12-15 DIAGNOSIS — R200 Anesthesia of skin: Secondary | ICD-10-CM | POA: Insufficient documentation

## 2023-12-15 LAB — COMPREHENSIVE METABOLIC PANEL WITH GFR
ALT: 7 U/L (ref 0–44)
AST: 16 U/L (ref 15–41)
Albumin: 5.1 g/dL — ABNORMAL HIGH (ref 3.5–5.0)
Alkaline Phosphatase: 74 U/L (ref 38–126)
Anion gap: 27 — ABNORMAL HIGH (ref 5–15)
BUN: 11 mg/dL (ref 6–20)
CO2: 11 mmol/L — ABNORMAL LOW (ref 22–32)
Calcium: 10.5 mg/dL — ABNORMAL HIGH (ref 8.9–10.3)
Chloride: 101 mmol/L (ref 98–111)
Creatinine, Ser: 0.68 mg/dL (ref 0.44–1.00)
GFR, Estimated: >60 mL/min (ref >60)
Glucose, Bld: 168 mg/dL — ABNORMAL HIGH (ref 70–99)
Potassium: 3.4 mmol/L — ABNORMAL LOW (ref 3.5–5.1)
Sodium: 139 mmol/L (ref 135–145)
Total Bilirubin: 0.6 mg/dL (ref 0.0–1.2)
Total Protein: 8.2 g/dL — ABNORMAL HIGH (ref 6.5–8.1)

## 2023-12-15 LAB — CBC WITH DIFFERENTIAL/PLATELET
Abs Immature Granulocytes: 0.08 10*3/uL — ABNORMAL HIGH (ref 0.00–0.07)
Basophils Absolute: 0.1 10*3/uL (ref 0.0–0.1)
Basophils Relative: 0 %
Eosinophils Absolute: 0.1 10*3/uL (ref 0.0–0.5)
Eosinophils Relative: 0 %
HCT: 44.3 % (ref 36.0–46.0)
Hemoglobin: 15.2 g/dL — ABNORMAL HIGH (ref 12.0–15.0)
Immature Granulocytes: 1 %
Lymphocytes Relative: 23 %
Lymphs Abs: 3.9 10*3/uL (ref 0.7–4.0)
MCH: 29.8 pg (ref 26.0–34.0)
MCHC: 34.3 g/dL (ref 30.0–36.0)
MCV: 86.9 fL (ref 80.0–100.0)
Monocytes Absolute: 0.8 10*3/uL (ref 0.1–1.0)
Monocytes Relative: 5 %
Neutro Abs: 11.8 10*3/uL — ABNORMAL HIGH (ref 1.7–7.7)
Neutrophils Relative %: 71 %
Platelets: 300 10*3/uL (ref 150–400)
RBC: 5.1 MIL/uL (ref 3.87–5.11)
RDW: 12.8 % (ref 11.5–15.5)
WBC: 16.7 10*3/uL — ABNORMAL HIGH (ref 4.0–10.5)
nRBC: 0 % (ref 0.0–0.2)

## 2023-12-15 LAB — TROPONIN T, HIGH SENSITIVITY: Troponin T High Sensitivity: <15 ng/L (ref <19)

## 2023-12-15 LAB — CBG MONITORING, ED: Glucose-Capillary: 167 mg/dL — ABNORMAL HIGH (ref 70–99)

## 2023-12-15 LAB — CK: Total CK: 46 U/L (ref 38–234)

## 2023-12-15 LAB — HCG, SERUM, QUALITATIVE: Preg, Serum: NEGATIVE

## 2023-12-15 LAB — MAGNESIUM: Magnesium: 1.9 mg/dL (ref 1.7–2.4)

## 2023-12-15 LAB — LIPASE, BLOOD: Lipase: 28 U/L (ref 11–51)

## 2023-12-15 MED ORDER — ONDANSETRON HCL 4 MG/2ML IJ SOLN
4.0000 mg | Freq: Once | INTRAMUSCULAR | Status: AC
  Administered 2023-12-15: 4 mg via INTRAVENOUS
  Filled 2023-12-15: qty 2

## 2023-12-15 MED ORDER — SODIUM CHLORIDE 0.9 % IV BOLUS
1000.0000 mL | Freq: Once | INTRAVENOUS | Status: AC
  Administered 2023-12-15: 1000 mL via INTRAVENOUS

## 2023-12-15 MED ORDER — IOHEXOL 350 MG/ML SOLN
100.0000 mL | Freq: Once | INTRAVENOUS | Status: AC | PRN
  Administered 2023-12-15: 100 mL via INTRAVENOUS

## 2023-12-15 NOTE — ED Provider Notes (Signed)
 [facility] [facility] Provider Note   CSN: [id number] Arrival date & time: 12/15/23  2057     History  Chief Complaint  Patient presents with   Numbness    Ruth Mccarthy is a 38 y.o. female history of ganglion cyst, here presenting with abdominal pain and nausea and vomiting and numbness.  Patient states that around 4 PM she had acute onset abdominal pain.  She states that she felt like she was vomiting but she could not.  She had several episodes of diarrhea.  She then subsequently had numbness and she felt like her arms and lips are cramped up.  Patient denies any headaches.  Patient denies any history of stroke.  Denies any trouble speaking  The history is provided by the patient.       Home Medications Prior to Admission medications   Medication Sig Start Date End Date Taking? Authorizing Provider  EPINEPHrine  (EPIPEN  2-PAK) 0.3 mg/0.3 mL IJ SOAJ injection Inject 0.3 mg into the muscle as needed for anaphylaxis. 02/04/23   O'Sullivan, Melissa, NP  FLUoxetine  (PROZAC ) 20 MG capsule TAKE 1 CAPSULE(20 MG) BY MOUTH DAILY 07/30/23   Dorrene Gaucher, NP      Allergies    Bee venom, Kiwi extract, and Shrimp [shellfish allergy]    Review of Systems   Review of Systems  Gastrointestinal:  Positive for abdominal pain and nausea.  Musculoskeletal:        Arms and leg cramps  Neurological:  Positive for numbness.  All other systems reviewed and are negative.   Physical Exam Updated Vital Signs BP (!) 122/99 (BP Location: Left Arm)   Pulse (!) 117   Temp 97.7 F (36.5 C) (Oral)   Resp (!) 23   Wt 75.3 kg   LMP 11/24/2023 (Approximate)   SpO2 100%   BMI 26.00 kg/m  Physical Exam Vitals and nursing note reviewed.  Constitutional:      Comments: Uncomfortable and patient appears dehydrated  HENT:     Head: Normocephalic.     Nose: Nose normal.     Mouth/Throat:     Mouth: Mucous membranes are dry.  Eyes:     Extraocular  Movements: Extraocular movements intact.  Cardiovascular:     Rate and Rhythm: Normal rate and regular rhythm.     Pulses: Normal pulses.     Heart sounds: Normal heart sounds.  Pulmonary:     Effort: Pulmonary effort is normal.     Breath sounds: Normal breath sounds.  Abdominal:     General: Abdomen is flat.     Palpations: Abdomen is soft.  Musculoskeletal:        General: Normal range of motion.     Cervical back: Normal range of motion and neck supple.  Skin:    General: Skin is warm.     Capillary Refill: Capillary refill takes less than 2 seconds.  Neurological:     Comments: Patient appears to have muscle cramps and arms and legs are cramped up and difficult to move.  Patient has normal sensation bilateral arms and legs and face.  No obvious facial droop.  Psychiatric:        Mood and Affect: Mood normal.        Behavior: Behavior normal.     ED Results / Procedures / Treatments   Labs (all labs ordered are listed, but only abnormal results are displayed) Labs Reviewed  CBC WITH DIFFERENTIAL/PLATELET - Abnormal; Notable for the following  components:      Result Value   WBC 16.7 (*)    Hemoglobin 15.2 (*)    Neutro Abs 11.8 (*)    Abs Immature Granulocytes 0.08 (*)    All other components within normal limits  CBG MONITORING, ED - Abnormal; Notable for the following components:   Glucose-Capillary 167 (*)    All other components within normal limits  CK  COMPREHENSIVE METABOLIC PANEL WITH GFR  LIPASE, BLOOD  MAGNESIUM  HCG, SERUM, QUALITATIVE  TROPONIN T, HIGH SENSITIVITY    EKG EKG Interpretation Date/Time:  Sunday December 15 2023 21:17:37 EDT Ventricular Rate:  97 PR Interval:  129 QRS Duration:  92 QT Interval:  404 QTC Calculation: 514 R Axis:   67  Text Interpretation: Sinus rhythm Abnormal R-wave progression, early transition Prolonged QT interval No previous ECGs available Confirmed by Florette Hurry [phone]) on 12/15/2023 9:20:05 PM  Radiology No  results found.  Procedures Procedures    Medications Ordered in ED Medications  sodium chloride  0.9 % bolus 1,000 mL (1,000 mLs Intravenous New Bag/Given 12/15/23 2134)  ondansetron  (ZOFRAN ) injection 4 mg (4 mg Intravenous Given 12/15/23 2140)    ED Course/ Medical Decision Making/ A&P                                 Medical Decision Making Ruth Mccarthy is a 38 y.o. female here presenting with nausea and abdominal pain and diarrhea and cramps.  Concern for possible hypokalemia or hypomagnesemia from diarrheal illness.  I have low suspicion for stroke.  Plan to get CBC and CMP and magnesium level.  Will get dissection study and also give IV fluids and reassess  11:25 PM Reviewed patient's labs and bicarb is 11 and anion gap is 27.  Potassium is 3.4 and magnesium is normal.  White blood cell count is elevated at 16.7.  Urinalysis is pending and CT scans are pending.  I ordered 2 L normal saline bolus.  Signed out to Dr. Carie Charity to follow-up with CT and reassess patient.  Patient states that she is already feeling better and no longer having severe cramps  Amount and/or Complexity of Data Reviewed Labs: ordered. Radiology: ordered.  Risk Prescription drug management.    Final Clinical Impression(s) / ED Diagnoses Final diagnoses:  None    Rx / DC Orders ED Discharge Orders     None         Dalene Duck, MD 12/15/23 2327

## 2023-12-15 NOTE — ED Triage Notes (Signed)
 POV/ c/o numbess to bil hands and legs around 1630 today/ difficulty speaking/ denies blood sugar issues/ N/V/D starting today/ no sig medical hx/ pt takes weight loss inj/ pt is pale and clammy in triage

## 2023-12-15 NOTE — ED Notes (Signed)
 Patient endorses she has been taking [place] since January.

## 2023-12-16 LAB — URINALYSIS, W/ REFLEX TO CULTURE (INFECTION SUSPECTED)
Bilirubin Urine: NEGATIVE
Glucose, UA: NEGATIVE mg/dL
Hgb urine dipstick: NEGATIVE
Ketones, ur: 40 mg/dL — AB
Leukocytes,Ua: NEGATIVE
Nitrite: NEGATIVE
Protein, ur: NEGATIVE mg/dL
Specific Gravity, Urine: <=1.005 (ref 1.005–1.030)
pH: 5.5 (ref 5.0–8.0)

## 2023-12-16 MED ORDER — ONDANSETRON 4 MG PO TBDP: ORAL_TABLET | ORAL | 0 refills | Status: DC

## 2023-12-16 MED ORDER — HYOSCYAMINE SULFATE 0.125 MG SL SUBL
0.2500 mg | SUBLINGUAL_TABLET | Freq: Once | SUBLINGUAL | Status: AC
  Administered 2023-12-16: 0.25 mg via SUBLINGUAL
  Filled 2023-12-16: qty 2

## 2023-12-16 MED ORDER — HYOSCYAMINE SULFATE 0.125 MG SL SUBL: 0.1250 mg | SUBLINGUAL_TABLET | SUBLINGUAL | 0 refills | Status: DC | PRN

## 2023-12-16 MED ORDER — METOCLOPRAMIDE HCL 5 MG/ML IJ SOLN
10.0000 mg | Freq: Once | INTRAMUSCULAR | Status: AC
  Administered 2023-12-16: 10 mg via INTRAVENOUS
  Filled 2023-12-16: qty 2

## 2023-12-16 NOTE — ED Provider Notes (Signed)
 Patient signed out to me by Dr. Delana Favors.  She presented with acute GI illness.  He was experiencing generalized weakness and tingling all over at arrival.  She likely had a mixed picture with anion gap acidosis at arrival, likely secondary to GI loss from diarrhea.  Patient has been hydrated and is now awake and alert, has been ambulatory to the bathroom.  She has slight nausea and cramping present but reports that she has had significant improvement.  Will finish her second liter of fluid and discharge with symptomatic relief.   Ballard Bongo, MD 12/16/23 [phone]

## 2023-12-31 ENCOUNTER — Ambulatory Visit: Admitting: Family

## 2023-12-31 VITALS — BP 110/80 | HR 87 | Temp 97.4°F | Resp 16 | Ht 67.0 in | Wt 164.0 lb

## 2023-12-31 DIAGNOSIS — Z87898 Personal history of other specified conditions: Secondary | ICD-10-CM | POA: Insufficient documentation

## 2023-12-31 DIAGNOSIS — F419 Anxiety disorder, unspecified: Secondary | ICD-10-CM

## 2023-12-31 DIAGNOSIS — Z91030 Bee allergy status: Secondary | ICD-10-CM

## 2023-12-31 DIAGNOSIS — F32A Depression, unspecified: Secondary | ICD-10-CM

## 2023-12-31 DIAGNOSIS — R739 Hyperglycemia, unspecified: Secondary | ICD-10-CM | POA: Insufficient documentation

## 2023-12-31 LAB — BASIC METABOLIC PANEL WITH GFR
BUN: 12 mg/dL (ref 6–23)
CO2: 26 meq/L (ref 19–32)
Calcium: 9.7 mg/dL (ref 8.4–10.5)
Chloride: 105 meq/L (ref 96–112)
Creatinine, Ser: 0.68 mg/dL (ref 0.40–1.20)
GFR: 110.5 mL/min (ref >60.00)
Glucose, Bld: 75 mg/dL (ref 70–99)
Potassium: 4.2 meq/L (ref 3.5–5.1)
Sodium: 140 meq/L (ref 135–145)

## 2023-12-31 LAB — HEMOGLOBIN A1C: Hgb A1c MFr Bld: 5.5 % (ref 4.6–6.5)

## 2023-12-31 MED ORDER — EPINEPHRINE 0.3 MG/0.3ML IJ SOAJ: 0.3000 mg | INTRAMUSCULAR | 1 refills | Status: AC | PRN

## 2023-12-31 NOTE — Assessment & Plan Note (Signed)
Will obtain A1C.   

## 2023-12-31 NOTE — Patient Instructions (Signed)
 VISIT SUMMARY:  You had a follow-up visit after an ER visit for diarrhea. Your symptoms have resolved, and you plan to resume your antidiabetic medication. We also discussed your bee sting allergy and the need for an updated EpiPen .  YOUR PLAN:  History of obesity- -Continue taking Wegovy at 2.4 mg as prescribed by your OB GYN.  ELEVATED BLOOD SUGAR: Your blood sugar was elevated during your ER visit. -We will order a diabetes test in the lab.  DEPRESSION: Your mood, anxiety, and depression are well-controlled with Prozac . -Continue taking Prozac  as prescribed.  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 -We will prescribe an updated EpiPen  for you.

## 2023-12-31 NOTE — Assessment & Plan Note (Signed)
 Needs updated epipen .

## 2023-12-31 NOTE — Assessment & Plan Note (Signed)
Stable on prozac, continue same. 

## 2023-12-31 NOTE — Progress Notes (Signed)
 Subjective:     Patient ID: Ruth Mccarthy, female    DOB: [birth year], 38 y.o.   MRN: [id number]  Chief Complaint  Patient presents with   Follow-up    Here for er follow up    HPI  Discussed the use of AI scribe software for clinical note transcription with the patient, who gave verbal consent to proceed.  History of Present Illness   Ruth Mccarthy is a 39 year old female who presents for follow-up after an ER visit for diarrhea.  Gastrointestinal symptoms - Diarrhea for four days, suspected viral etiology - Symptoms have resolved and bowel habits have returned to baseline - she had a CT head performed which was negative a well negative CTA chest. These studies were done due to numbness and to rule out AA syndrome.  History of obesity- - Plans to resume Lexiva now that symptoms have resolved - Blood glucose was mildly elevated during the ER visit  Allergy to bee venom - Allergy to bee stings - Does not have an updated EpiPen  - Home environment has been treated to reduce bee exposure    Health Maintenance Due  Topic Date Due   Hepatitis B Vaccines (1 of 3 - 19+ 3-dose series) Never done   COVID-19 Vaccine (1 - 2024-25 season) Never done    Past Medical History:  Diagnosis Date   Broken finger    No pertinent past medical history    Sciatica, left side 03/14/2021   Spontaneous vaginal delivery 12/23/2018   SVD (spontaneous vaginal delivery) 01/08/2014    Past Surgical History:  Procedure Laterality Date   EYE SURGERY     lasix   NO PAST SURGERIES     WISDOM TOOTH EXTRACTION      Family History  Problem Relation Age of Onset   Alcohol abuse Mother    Cancer Mother        cervix   Hypertension Mother    Alcohol abuse Maternal Uncle    Hyperlipidemia Maternal Grandmother    Vision loss Maternal Grandmother    Colon polyps Maternal Grandmother    Diabetes Maternal Grandfather    Hypertension Maternal Grandfather    Stroke Maternal Grandfather     Anesthesia problems Neg Hx     Social History   Socioeconomic History   Marital status: Married    Spouse name: Not on file   Number of children: 3   Years of education: Not on file   Highest education level: Bachelor's degree (e.g., BA, AB, BS)  Occupational History   Occupation: Runner, broadcasting/film/video  Tobacco Use   Smoking status: Never   Smokeless tobacco: Never  Vaping Use   Vaping status: Never Used  Substance and Sexual Activity   Alcohol use: No   Drug use: No   Sexual activity: Yes    Partners: Male    Birth control/protection: Condom  Other Topics Concern   Not on file  Social History Narrative   Daughter- 2013 Shona   [id number] Leontine   Daughter-2020 Schuyler   Married   Works as a Therapist, sports   Completed bachelors   Dog (Morkie- Cytogeneticist)   Enjoys playing games on her phone   In-laws are local   Husband is in [employer]- deployed.    Social Drivers of Corporate investment banker Strain: Low Risk  (11/12/2022)   Overall Financial Resource Strain (CARDIA)    Difficulty of Paying Living Expenses: Not hard at all  Food  Insecurity: No Food Insecurity (11/12/2022)   Hunger Vital Sign    Worried About Running Out of Food in the Last Year: Never true    Ran Out of Food in the Last Year: Never true  Transportation Needs: No Transportation Needs (11/12/2022)   PRAPARE - Administrator, Civil Service (Medical): No    Lack of Transportation (Non-Medical): No  Physical Activity: Unknown (11/12/2022)   Exercise Vital Sign    Days of Exercise per Week: 2 days    Minutes of Exercise per Session: Not on file  Stress: No Stress Concern Present (11/12/2022)   [employer] of Occupational Health - Occupational Stress Questionnaire    Feeling of Stress : Only a little  Social Connections: Moderately Integrated (11/12/2022)   Social Connection and Isolation Panel    Frequency of Communication with Friends and Family: More than three times a week     Frequency of Social Gatherings with Friends and Family: Once a week    Attends Religious Services: More than 4 times per year    Active Member of [employer] or Organizations: No    Attends Engineer, structural: Not on file    Marital Status: Married  Catering manager Violence: Not At Risk (12/15/2018)   Humiliation, Afraid, Rape, and Kick questionnaire    Fear of Current or Ex-Partner: No    Emotionally Abused: No    Physically Abused: No    Sexually Abused: No    Outpatient Medications Prior to Visit  Medication Sig Dispense Refill   Semaglutide-Weight Management (WEGOVY) 2.4 MG/0.75ML SOAJ Inject 2.4 mg into the skin once a week.     FLUoxetine  (PROZAC ) 20 MG capsule TAKE 1 CAPSULE(20 MG) BY MOUTH DAILY 90 capsule 1   EPINEPHrine  (EPIPEN  2-PAK) 0.3 mg/0.3 mL IJ SOAJ injection Inject 0.3 mg into the muscle as needed for anaphylaxis. 1 each 1   hyoscyamine  (LEVSIN  SL) 0.125 MG SL tablet Place 1 tablet (0.125 mg total) under the tongue every 4 (four) hours as needed for cramping (abdominal pain). 30 tablet 0   ondansetron  (ZOFRAN -ODT) 4 MG disintegrating tablet 4mg  ODT q4 hours prn nausea/vomit 10 tablet 0   No facility-administered medications prior to visit.    Allergies  Allergen Reactions   Bee Venom     Local swelling   Kiwi Extract Itching   Shrimp [Shellfish Allergy] Diarrhea and Nausea And Vomiting    ROS    See HPI Objective:    Physical Exam Constitutional:      General: She is not in acute distress.    Appearance: Normal appearance. She is well-developed.  HENT:     Head: Normocephalic and atraumatic.     Right Ear: External ear normal.     Left Ear: External ear normal.   Eyes:     General: No scleral icterus.  Neck:     Thyroid : No thyromegaly.   Cardiovascular:     Rate and Rhythm: Normal rate and regular rhythm.     Heart sounds: Normal heart sounds. No murmur heard. Pulmonary:     Effort: Pulmonary effort is normal. No respiratory distress.      Breath sounds: Normal breath sounds. No wheezing.   Musculoskeletal:     Cervical back: Neck supple.   Skin:    General: Skin is warm and dry.   Neurological:     Mental Status: She is alert and oriented to person, place, and time.   Psychiatric:  Mood and Affect: Mood normal.        Behavior: Behavior normal.        Thought Content: Thought content normal.        Judgment: Judgment normal.      BP 110/80 (BP Location: Right Arm, Patient Position: Sitting, Cuff Size: Small)   Pulse 87   Temp (!) 97.4 F (36.3 C) (Oral)   Resp 16   Ht 5' 7 (1.702 m)   Wt 164 lb (74.4 kg)   LMP 11/24/2023 (Approximate)   SpO2 100%   BMI 25.69 kg/m  Wt Readings from Last 3 Encounters:  12/31/23 164 lb (74.4 kg)  12/15/23 166 lb (75.3 kg)  03/15/23 207 lb 3.2 oz (94 kg)       Assessment & Plan:   Problem List Items Addressed This Visit       Unprioritized   Hyperglycemia - Primary   Will obtain A1C.      Relevant Orders   HgB A1c   Basic Metabolic Panel (BMET)   History of diarrhea   Resolved.  Like due to virus.        Bee allergy status   Needs updated epipen .      Relevant Medications   EPINEPHrine  (EPIPEN  2-PAK) 0.3 mg/0.3 mL IJ SOAJ injection   Anxiety and depression   Stable on prozac , continue same.        I have discontinued Staley L. Manson's hyoscyamine  and ondansetron . I am also having her maintain her FLUoxetine , EPINEPHrine , and Wegovy.  Meds ordered this encounter  Medications   EPINEPHrine  (EPIPEN  2-PAK) 0.3 mg/0.3 mL IJ SOAJ injection    Sig: Inject 0.3 mg into the muscle as needed for anaphylaxis.    Dispense:  1 each    Refill:  1    Supervising Provider:   DOMENICA BLACKBIRD A [[id number]]

## 2023-12-31 NOTE — Assessment & Plan Note (Signed)
 Resolved.  Like due to virus.

## 2024-01-01 ENCOUNTER — Ambulatory Visit: Payer: Self-pay | Admitting: Family

## 2024-02-13 ENCOUNTER — Other Ambulatory Visit: Payer: Self-pay

## 2024-02-13 MED ORDER — FLUOXETINE HCL 20 MG PO CAPS
ORAL_CAPSULE | ORAL | 1 refills | Status: AC
Start: 2024-02-13 — End: ?

## 2024-02-24 ENCOUNTER — Emergency Department (HOSPITAL_COMMUNITY): Admitting: Registered Nurse

## 2024-02-24 ENCOUNTER — Encounter (HOSPITAL_COMMUNITY)
Admission: EM | Disposition: A | Discharge status: Home / Self Care | Payer: Self-pay | Source: Home / Self Care | Attending: Emergency Medicine

## 2024-02-24 ENCOUNTER — Emergency Department (HOSPITAL_BASED_OUTPATIENT_CLINIC_OR_DEPARTMENT_OTHER): Admitting: Registered Nurse

## 2024-02-24 ENCOUNTER — Other Ambulatory Visit: Payer: Self-pay

## 2024-02-24 ENCOUNTER — Encounter (HOSPITAL_BASED_OUTPATIENT_CLINIC_OR_DEPARTMENT_OTHER): Payer: Self-pay | Admitting: Emergency Medicine

## 2024-02-24 ENCOUNTER — Emergency Department (HOSPITAL_BASED_OUTPATIENT_CLINIC_OR_DEPARTMENT_OTHER)

## 2024-02-24 ENCOUNTER — Observation Stay (HOSPITAL_BASED_OUTPATIENT_CLINIC_OR_DEPARTMENT_OTHER)
Admission: EM | Admit: 2024-02-24 | Discharge: 2024-02-25 | Disposition: A | Discharge status: Home / Self Care | Attending: General Surgery | Admitting: General Surgery

## 2024-02-24 DIAGNOSIS — K3580 Unspecified acute appendicitis: Principal | ICD-10-CM | POA: Diagnosis present

## 2024-02-24 DIAGNOSIS — R200 Anesthesia of skin: Secondary | ICD-10-CM | POA: Insufficient documentation

## 2024-02-24 DIAGNOSIS — E876 Hypokalemia: Secondary | ICD-10-CM | POA: Insufficient documentation

## 2024-02-24 DIAGNOSIS — R1030 Lower abdominal pain, unspecified: Secondary | ICD-10-CM | POA: Diagnosis present

## 2024-02-24 HISTORY — PX: LAPAROSCOPIC APPENDECTOMY: SHX408

## 2024-02-24 LAB — COMPREHENSIVE METABOLIC PANEL WITH GFR
ALT: <5 U/L (ref 0–44)
ALT: 7 U/L (ref 0–44)
AST: 15 U/L (ref 15–41)
AST: 13 U/L — ABNORMAL LOW (ref 15–41)
Albumin: 4.8 g/dL (ref 3.5–5.0)
Albumin: 4.1 g/dL (ref 3.5–5.0)
Alkaline Phosphatase: 60 U/L (ref 38–126)
Alkaline Phosphatase: 54 U/L (ref 38–126)
Anion gap: 22 — ABNORMAL HIGH (ref 5–15)
Anion gap: 15 (ref 5–15)
BUN: 9 mg/dL (ref 6–20)
BUN: 8 mg/dL (ref 6–20)
CO2: 13 mmol/L — ABNORMAL LOW (ref 22–32)
CO2: 16 mmol/L — ABNORMAL LOW (ref 22–32)
Calcium: 10.1 mg/dL (ref 8.9–10.3)
Calcium: 8.5 mg/dL — ABNORMAL LOW (ref 8.9–10.3)
Chloride: 103 mmol/L (ref 98–111)
Chloride: 107 mmol/L (ref 98–111)
Creatinine, Ser: 0.71 mg/dL (ref 0.44–1.00)
Creatinine, Ser: 0.68 mg/dL (ref 0.44–1.00)
GFR, Estimated: >60 mL/min (ref >60)
GFR, Estimated: >60 mL/min (ref >60)
Glucose, Bld: 132 mg/dL — ABNORMAL HIGH (ref 70–99)
Glucose, Bld: 85 mg/dL (ref 70–99)
Potassium: 3.1 mmol/L — ABNORMAL LOW (ref 3.5–5.1)
Potassium: 3.3 mmol/L — ABNORMAL LOW (ref 3.5–5.1)
Sodium: 138 mmol/L (ref 135–145)
Sodium: 138 mmol/L (ref 135–145)
Total Bilirubin: 0.4 mg/dL (ref 0.0–1.2)
Total Bilirubin: 0.3 mg/dL (ref 0.0–1.2)
Total Protein: 7.6 g/dL (ref 6.5–8.1)
Total Protein: 6.3 g/dL — ABNORMAL LOW (ref 6.5–8.1)

## 2024-02-24 LAB — CBC
HCT: 40.5 % (ref 36.0–46.0)
Hemoglobin: 13.9 g/dL (ref 12.0–15.0)
MCH: 29.8 pg (ref 26.0–34.0)
MCHC: 34.3 g/dL (ref 30.0–36.0)
MCV: 86.9 fL (ref 80.0–100.0)
Platelets: 397 K/uL (ref 150–400)
RBC: 4.66 MIL/uL (ref 3.87–5.11)
RDW: 12.5 % (ref 11.5–15.5)
WBC: 15.7 K/uL — ABNORMAL HIGH (ref 4.0–10.5)
nRBC: 0 % (ref 0.0–0.2)

## 2024-02-24 LAB — URINALYSIS, ROUTINE W REFLEX MICROSCOPIC
Bilirubin Urine: NEGATIVE
Glucose, UA: NEGATIVE mg/dL
Hgb urine dipstick: NEGATIVE
Ketones, ur: >=80 mg/dL — AB
Leukocytes,Ua: NEGATIVE
Nitrite: NEGATIVE
Protein, ur: NEGATIVE mg/dL
Specific Gravity, Urine: 1.015 (ref 1.005–1.030)
pH: 6 (ref 5.0–8.0)

## 2024-02-24 LAB — MAGNESIUM: Magnesium: 1.9 mg/dL (ref 1.7–2.4)

## 2024-02-24 LAB — TROPONIN T, HIGH SENSITIVITY: Troponin T High Sensitivity: <15 ng/L (ref 0–19)

## 2024-02-24 LAB — LIPASE, BLOOD: Lipase: 28 U/L (ref 11–51)

## 2024-02-24 LAB — HCG, SERUM, QUALITATIVE: Preg, Serum: NEGATIVE

## 2024-02-24 LAB — CBG MONITORING, ED: Glucose-Capillary: 145 mg/dL — ABNORMAL HIGH (ref 70–99)

## 2024-02-24 SURGERY — APPENDECTOMY, LAPAROSCOPIC
Anesthesia: General

## 2024-02-24 MED ORDER — DIPHENHYDRAMINE HCL 50 MG/ML IJ SOLN
25.0000 mg | Freq: Four times a day (QID) | INTRAMUSCULAR | Status: DC | PRN
Start: 2024-02-24 — End: 2024-02-25

## 2024-02-24 MED ORDER — ROCURONIUM BROMIDE 10 MG/ML (PF) SYRINGE
PREFILLED_SYRINGE | INTRAVENOUS | Status: AC
  Filled 2024-02-24: qty 10

## 2024-02-24 MED ORDER — FENTANYL CITRATE PF 50 MCG/ML IJ SOSY: 25.0000 ug | PREFILLED_SYRINGE | INTRAMUSCULAR | Status: DC | PRN

## 2024-02-24 MED ORDER — LACTATED RINGERS IV SOLN: INTRAVENOUS | Status: DC | PRN

## 2024-02-24 MED ORDER — KETOROLAC TROMETHAMINE 30 MG/ML IJ SOLN: 30.0000 mg | Freq: Once | INTRAMUSCULAR | Status: DC | PRN

## 2024-02-24 MED ORDER — DIPHENHYDRAMINE HCL 25 MG PO CAPS: 25.0000 mg | ORAL_CAPSULE | Freq: Four times a day (QID) | ORAL | Status: DC | PRN

## 2024-02-24 MED ORDER — MIDAZOLAM HCL 2 MG/2ML IJ SOLN
INTRAMUSCULAR | Status: AC
  Filled 2024-02-24: qty 2

## 2024-02-24 MED ORDER — PROPOFOL 10 MG/ML IV BOLUS
INTRAVENOUS | Status: DC | PRN
  Administered 2024-02-24: 150 mg via INTRAVENOUS

## 2024-02-24 MED ORDER — ONDANSETRON HCL 4 MG/2ML IJ SOLN
INTRAMUSCULAR | Status: DC | PRN
  Administered 2024-02-24: 4 mg via INTRAVENOUS

## 2024-02-24 MED ORDER — OXYCODONE HCL 5 MG/5ML PO SOLN: 5.0000 mg | Freq: Once | ORAL | Status: DC | PRN

## 2024-02-24 MED ORDER — SODIUM CHLORIDE 0.9 % IV BOLUS
1000.0000 mL | Freq: Once | INTRAVENOUS | Status: AC
  Administered 2024-02-24: 1000 mL via INTRAVENOUS

## 2024-02-24 MED ORDER — ONDANSETRON HCL 4 MG/2ML IJ SOLN
INTRAMUSCULAR | Status: AC
  Filled 2024-02-24: qty 2

## 2024-02-24 MED ORDER — FENTANYL CITRATE PF 50 MCG/ML IJ SOSY
50.0000 ug | PREFILLED_SYRINGE | Freq: Once | INTRAMUSCULAR | Status: AC
  Administered 2024-02-24: 50 ug via INTRAVENOUS
  Filled 2024-02-24: qty 1

## 2024-02-24 MED ORDER — ONDANSETRON HCL 4 MG/2ML IJ SOLN: 4.0000 mg | Freq: Four times a day (QID) | INTRAMUSCULAR | Status: DC | PRN

## 2024-02-24 MED ORDER — MORPHINE SULFATE (PF) 4 MG/ML IV SOLN
4.0000 mg | Freq: Once | INTRAVENOUS | Status: AC
  Administered 2024-02-24: 4 mg via INTRAVENOUS
  Filled 2024-02-24: qty 1

## 2024-02-24 MED ORDER — IOHEXOL 300 MG/ML  SOLN
100.0000 mL | Freq: Once | INTRAMUSCULAR | Status: AC | PRN
  Administered 2024-02-24: 100 mL via INTRAVENOUS

## 2024-02-24 MED ORDER — ACETAMINOPHEN 10 MG/ML IV SOLN: 1000.0000 mg | Freq: Once | INTRAVENOUS | Status: DC | PRN

## 2024-02-24 MED ORDER — ONDANSETRON 4 MG PO TBDP
4.0000 mg | ORAL_TABLET | Freq: Four times a day (QID) | ORAL | Status: DC | PRN
Start: 2024-02-24 — End: 2024-02-25

## 2024-02-24 MED ORDER — MIDAZOLAM HCL 2 MG/2ML IJ SOLN
INTRAMUSCULAR | Status: DC | PRN
  Administered 2024-02-24: 2 mg via INTRAVENOUS

## 2024-02-24 MED ORDER — METRONIDAZOLE 500 MG/100ML IV SOLN
500.0000 mg | Freq: Once | INTRAVENOUS | Status: AC
  Administered 2024-02-24: 500 mg via INTRAVENOUS
  Filled 2024-02-24: qty 100

## 2024-02-24 MED ORDER — OXYCODONE HCL 5 MG PO TABS: 5.0000 mg | ORAL_TABLET | Freq: Once | ORAL | Status: DC | PRN

## 2024-02-24 MED ORDER — METOPROLOL TARTRATE 5 MG/5ML IV SOLN: 5.0000 mg | Freq: Four times a day (QID) | INTRAVENOUS | Status: DC | PRN

## 2024-02-24 MED ORDER — SUCCINYLCHOLINE CHLORIDE 200 MG/10ML IV SOSY
PREFILLED_SYRINGE | INTRAVENOUS | Status: DC | PRN
  Administered 2024-02-24: 60 mg via INTRAVENOUS

## 2024-02-24 MED ORDER — OXYCODONE HCL 5 MG PO TABS: 5.0000 mg | ORAL_TABLET | ORAL | Status: DC | PRN

## 2024-02-24 MED ORDER — ONDANSETRON HCL 4 MG/2ML IJ SOLN
4.0000 mg | Freq: Once | INTRAMUSCULAR | Status: AC
  Administered 2024-02-24: 4 mg via INTRAVENOUS
  Filled 2024-02-24: qty 2

## 2024-02-24 MED ORDER — SUGAMMADEX SODIUM 200 MG/2ML IV SOLN
INTRAVENOUS | Status: DC | PRN
  Administered 2024-02-24 (×2): 100 mg via INTRAVENOUS

## 2024-02-24 MED ORDER — HYDROMORPHONE HCL 1 MG/ML IJ SOLN
1.0000 mg | Freq: Once | INTRAMUSCULAR | Status: AC
  Administered 2024-02-24: 1 mg via INTRAVENOUS
  Filled 2024-02-24: qty 1

## 2024-02-24 MED ORDER — HYDROMORPHONE HCL 1 MG/ML IJ SOLN: 0.5000 mg | INTRAMUSCULAR | Status: DC | PRN

## 2024-02-24 MED ORDER — SODIUM CHLORIDE 0.9 % IV SOLN
2.0000 g | Freq: Once | INTRAVENOUS | Status: AC
  Administered 2024-02-24: 2 g via INTRAVENOUS
  Filled 2024-02-24: qty 20

## 2024-02-24 MED ORDER — TRAMADOL HCL 50 MG PO TABS: 50.0000 mg | ORAL_TABLET | Freq: Four times a day (QID) | ORAL | Status: DC | PRN

## 2024-02-24 MED ORDER — AMISULPRIDE (ANTIEMETIC) 5 MG/2ML IV SOLN: 10.0000 mg | Freq: Once | INTRAVENOUS | Status: DC | PRN

## 2024-02-24 MED ORDER — LIDOCAINE HCL (PF) 2 % IJ SOLN
INTRAMUSCULAR | Status: AC
  Filled 2024-02-24: qty 5

## 2024-02-24 MED ORDER — IBUPROFEN 400 MG PO TABS: 600.0000 mg | ORAL_TABLET | Freq: Four times a day (QID) | ORAL | Status: DC | PRN

## 2024-02-24 MED ORDER — ACETAMINOPHEN 10 MG/ML IV SOLN
INTRAVENOUS | Status: DC | PRN
  Administered 2024-02-24: 1000 mg via INTRAVENOUS

## 2024-02-24 MED ORDER — POTASSIUM CHLORIDE 20 MEQ PO PACK
40.0000 meq | PACK | Freq: Once | ORAL | Status: AC
  Administered 2024-02-24: 40 meq via ORAL
  Filled 2024-02-24: qty 2

## 2024-02-24 MED ORDER — LIDOCAINE VISCOUS HCL 2 % MT SOLN
15.0000 mL | Freq: Once | OROMUCOSAL | Status: AC
  Administered 2024-02-24: 15 mL via ORAL
  Filled 2024-02-24: qty 15

## 2024-02-24 MED ORDER — FENTANYL CITRATE (PF) 250 MCG/5ML IJ SOLN
INTRAMUSCULAR | Status: DC | PRN
  Administered 2024-02-24: 100 ug via INTRAVENOUS
  Administered 2024-02-24: 50 ug via INTRAVENOUS

## 2024-02-24 MED ORDER — PROPOFOL 10 MG/ML IV BOLUS
INTRAVENOUS | Status: AC
Start: 2024-02-24 — End: 2024-02-24
  Filled 2024-02-24: qty 20

## 2024-02-24 MED ORDER — BUPIVACAINE-EPINEPHRINE 0.25% -1:200000 IJ SOLN
INTRAMUSCULAR | Status: DC | PRN
  Administered 2024-02-24: 18 mL

## 2024-02-24 MED ORDER — DEXAMETHASONE SODIUM PHOSPHATE 10 MG/ML IJ SOLN
INTRAMUSCULAR | Status: AC
  Filled 2024-02-24: qty 1

## 2024-02-24 MED ORDER — LIDOCAINE 2% (20 MG/ML) 5 ML SYRINGE
INTRAMUSCULAR | Status: DC | PRN
  Administered 2024-02-24: 60 mg via INTRAVENOUS

## 2024-02-24 MED ORDER — ALUM & MAG HYDROXIDE-SIMETH 200-200-20 MG/5ML PO SUSP
30.0000 mL | Freq: Once | ORAL | Status: AC
  Administered 2024-02-24: 30 mL via ORAL
  Filled 2024-02-24: qty 30

## 2024-02-24 MED ORDER — FENTANYL CITRATE (PF) 250 MCG/5ML IJ SOLN
INTRAMUSCULAR | Status: AC
  Filled 2024-02-24: qty 5

## 2024-02-24 MED ORDER — DEXAMETHASONE SODIUM PHOSPHATE 10 MG/ML IJ SOLN
INTRAMUSCULAR | Status: DC | PRN
  Administered 2024-02-24: 5 mg via INTRAVENOUS

## 2024-02-24 MED ORDER — BUPIVACAINE-EPINEPHRINE (PF) 0.25% -1:200000 IJ SOLN
INTRAMUSCULAR | Status: AC
  Filled 2024-02-24: qty 30

## 2024-02-24 MED ORDER — ENOXAPARIN SODIUM 40 MG/0.4ML IJ SOSY
40.0000 mg | PREFILLED_SYRINGE | INTRAMUSCULAR | Status: DC
  Administered 2024-02-25: 40 mg via SUBCUTANEOUS
  Filled 2024-02-24: qty 0.4

## 2024-02-24 MED ORDER — ROCURONIUM BROMIDE 10 MG/ML (PF) SYRINGE
PREFILLED_SYRINGE | INTRAVENOUS | Status: DC | PRN
  Administered 2024-02-24: 30 mg via INTRAVENOUS

## 2024-02-24 MED ORDER — RINGERS IRRIGATION IR SOLN
Status: DC | PRN
  Administered 2024-02-24: 1000 mL

## 2024-02-24 MED ORDER — ACETAMINOPHEN 10 MG/ML IV SOLN
INTRAVENOUS | Status: AC
  Filled 2024-02-24: qty 100

## 2024-02-24 MED ORDER — SUCCINYLCHOLINE CHLORIDE 200 MG/10ML IV SOSY
PREFILLED_SYRINGE | INTRAVENOUS | Status: AC
  Filled 2024-02-24: qty 10

## 2024-02-24 SURGICAL SUPPLY — 39 items
BAG COUNTER SPONGE SURGICOUNT (BAG) IMPLANT
BNDG ADH 1X3 SHEER STRL LF (GAUZE/BANDAGES/DRESSINGS) ×5 IMPLANT
CABLE HIGH FREQUENCY MONO STRZ (ELECTRODE) ×1 IMPLANT
CLIP APPLIE 5 13 M/L LIGAMAX5 (MISCELLANEOUS) IMPLANT
CLIP APPLIE ROT 10 11.4 M/L (STAPLE) IMPLANT
CLIP LIGATING HEMO LOK XL GOLD (MISCELLANEOUS) ×1 IMPLANT
COVER SURGICAL LIGHT HANDLE (MISCELLANEOUS) ×1 IMPLANT
CUTTER FLEX LINEAR 45M (STAPLE) IMPLANT
DERMABOND ADVANCED .7 DNX12 (GAUZE/BANDAGES/DRESSINGS) ×1 IMPLANT
DRAIN CHANNEL 19F RND (DRAIN) IMPLANT
DRAPE LAPAROSCOPIC ABDOMINAL (DRAPES) ×1 IMPLANT
ELECT REM PT RETURN 15FT ADLT (MISCELLANEOUS) ×1 IMPLANT
ENDOLOOP SUT PDS II 0 18 (SUTURE) IMPLANT
EVACUATOR SILICONE 100CC (DRAIN) IMPLANT
GLOVE BIOGEL PI IND STRL 7.0 (GLOVE) ×1 IMPLANT
GLOVE SURG SS PI 7.0 STRL IVOR (GLOVE) ×1 IMPLANT
GOWN STRL REUS W/ TWL LRG LVL3 (GOWN DISPOSABLE) ×1 IMPLANT
GOWN STRL REUS W/ TWL XL LVL3 (GOWN DISPOSABLE) IMPLANT
GRASPER SUT TROCAR 14GX15 (MISCELLANEOUS) IMPLANT
IRRIGATION SUCT STRKRFLW 2 WTP (MISCELLANEOUS) ×1 IMPLANT
KIT BASIN OR (CUSTOM PROCEDURE TRAY) ×1 IMPLANT
KIT TURNOVER KIT A (KITS) ×1 IMPLANT
PENCIL SMOKE EVACUATOR (MISCELLANEOUS) IMPLANT
POUCH RETRIEVAL ECOSAC 10 (ENDOMECHANICALS) ×1 IMPLANT
RELOAD STAPLE 45 2.5 WHT GRN (ENDOMECHANICALS) IMPLANT
RELOAD STAPLE 45 3.5 BLU ETS (ENDOMECHANICALS) IMPLANT
SCISSORS LAP 5X35 DISP (ENDOMECHANICALS) ×1 IMPLANT
SET TUBE SMOKE EVAC HIGH FLOW (TUBING) ×1 IMPLANT
SHEARS HARMONIC 36 ACE (MISCELLANEOUS) IMPLANT
SLEEVE Z-THREAD 5X100MM (TROCAR) ×1 IMPLANT
SPIKE FLUID TRANSFER (MISCELLANEOUS) ×1 IMPLANT
STRIP CLOSURE SKIN 1/4X4 (GAUZE/BANDAGES/DRESSINGS) ×1 IMPLANT
SUT ETHILON 2 0 PS N (SUTURE) IMPLANT
SUT MNCRL AB 4-0 PS2 18 (SUTURE) ×1 IMPLANT
SUT VIC AB 0 UR5 27 (SUTURE) IMPLANT
TOWEL OR 17X26 10 PK STRL BLUE (TOWEL DISPOSABLE) ×1 IMPLANT
TRAY LAPAROSCOPIC (CUSTOM PROCEDURE TRAY) ×1 IMPLANT
TROCAR ADV FIXATION 12X100MM (TROCAR) ×1 IMPLANT
TROCAR Z-THREAD OPTICAL 5X100M (TROCAR) ×1 IMPLANT

## 2024-02-24 NOTE — ED Notes (Signed)
 Patient transported to CT

## 2024-02-24 NOTE — ED Provider Notes (Addendum)
 [facility] Provider Note   CSN: [id number] Arrival date & time: 02/24/24  1438     Patient presents with: Abdominal Pain   Ruth Mccarthy is a 38 y.o. female with no pertinent past medical history presents to emergency department for evaluation of worsening sharp upper abdominal pain and nausea that started at 1300 today.  Has been having small amounts of hard stool over the weekend. Last BM was 1 hour ago and normal.  Normally drinks one 16oz bottle of water a day. No previous abdominal surgeries. Is on Ozempic and has been on same dose of 2.4mg  for past three months. LMP first week of August.  Denies vomiting, diarrhea, hematochezia, rectal bleeding, recent antibiotics, recent travel, known suspicious foods, fevers, known sick contacts.  Also endorses cramping and numbness to bilateral hands and feet. This resolved soon following arrival to ED. No hx of CVA, TIA. No slurred speech, aphasia, unilateral weakness, nor numbness.  Of note, was evaluated in ED on 12/15/2023 for suspected gastroenteritis with similar complaints of abdominal pain, nausea, numbness.  Did have diarrhea at that time.  CT angio chest abdomen pelvis dissection study WNL with no signs of aneurysm, dissection, nor PE. CT head negative for ICH     Abdominal Pain      Prior to Admission medications   Medication Sig Start Date End Date Taking? Authorizing Provider  EPINEPHrine  (EPIPEN  2-PAK) 0.3 mg/0.3 mL IJ SOAJ injection Inject 0.3 mg into the muscle as needed for anaphylaxis. 12/31/23   O'Sullivan, Melissa, NP  FLUoxetine  (PROZAC ) 20 MG capsule TAKE 1 CAPSULE(20 MG) BY MOUTH DAILY 02/13/24   Daryl Setter, NP  Semaglutide -Weight Management (WEGOVY ) 2.4 MG/0.75ML SOAJ Inject 2.4 mg into the skin once a week.    [provider]    Allergies: Bee venom, Kiwi extract, and Shrimp [shellfish allergy]    Review of Systems  Gastrointestinal:  Positive for  abdominal pain.    Updated Vital Signs BP 126/84 (BP Location: Right Arm)   Pulse 89   Temp 98.1 F (36.7 C) (Oral)   Resp 18   Ht 5' 7 (1.702 m)   Wt 68.5 kg   LMP 02/10/2024 (Approximate)   SpO2 100%   BMI 23.65 kg/m   Physical Exam Vitals and nursing note reviewed.  Constitutional:      General: She is awake.     Comments: Mildly clammy and pale.  Acutely ill-appearing.  This improved following analgesia, Zofran , IVF.  HENT:     Head: Normocephalic and atraumatic.  Eyes:     General: No visual field deficit.    Conjunctiva/sclera: Conjunctivae normal.  Cardiovascular:     Rate and Rhythm: Tachycardia present.     Pulses:          Radial pulses are 2+ on the right side and 2+ on the left side.       Dorsalis pedis pulses are 2+ on the left side.     Comments: 103bpm Pulmonary:     Effort: Pulmonary effort is normal. No respiratory distress.  Abdominal:     Tenderness: There is abdominal tenderness in the right upper quadrant, epigastric area, periumbilical area and left upper quadrant. There is no guarding or rebound.  Musculoskeletal:     Right lower leg: No edema.     Left lower leg: No edema.  Skin:    Coloration: Skin is not jaundiced or pale.  Neurological:     Mental Status: She  is alert and oriented to person, place, and time. Mental status is at baseline.     GCS: GCS eye subscore is 4. GCS verbal subscore is 5. GCS motor subscore is 6.     Cranial Nerves: Cranial nerves 2-12 are intact. No cranial nerve deficit, dysarthria or facial asymmetry.     Sensory: Sensation is intact.     Motor: No weakness, tremor, atrophy, abnormal muscle tone, seizure activity or pronator drift.     Coordination: Coordination is intact.     Gait: Gait is intact.     Comments: Motor 5/5 and sensation 2/2 BUE and BLE.  No slurred speech nor aphasia.     (all labs ordered are listed, but only abnormal results are displayed) Labs Reviewed  COMPREHENSIVE METABOLIC PANEL WITH  GFR - Abnormal; Notable for the following components:      Result Value   Potassium 3.1 (*)    CO2 13 (*)    Glucose, Bld 132 (*)    Anion gap 22 (*)    All other components within normal limits  CBC - Abnormal; Notable for the following components:   WBC 15.7 (*)    All other components within normal limits  COMPREHENSIVE METABOLIC PANEL WITH GFR - Abnormal; Notable for the following components:   Potassium 3.3 (*)    CO2 16 (*)    Calcium 8.5 (*)    Total Protein 6.3 (*)    AST 13 (*)    All other components within normal limits  CBG MONITORING, ED - Abnormal; Notable for the following components:   Glucose-Capillary 145 (*)    All other components within normal limits  LIPASE, BLOOD  HCG, SERUM, QUALITATIVE  MAGNESIUM  URINALYSIS, ROUTINE W REFLEX MICROSCOPIC  TROPONIN T, HIGH SENSITIVITY    EKG: None  Radiology: CT ABDOMEN PELVIS W CONTRAST Result Date: 02/24/2024 CLINICAL DATA:  Acute abdominal pain. EXAM: CT ABDOMEN AND PELVIS WITH CONTRAST TECHNIQUE: Multidetector CT imaging of the abdomen and pelvis was performed using the standard protocol following bolus administration of intravenous contrast. RADIATION DOSE REDUCTION: This exam was performed according to the departmental dose-optimization program which includes automated exposure control, adjustment of the mA and/or kV according to patient size and/or use of iterative reconstruction technique. CONTRAST:  OMNIPAQUE  IOHEXOL  300 MG/ML  SOLN COMPARISON:  CT angiogram chest abdomen and pelvis 12/15/2023. FINDINGS: Lower chest: There is a stable 2 mm left lower lobe nodule image 6/13. Hepatobiliary: No focal liver abnormality is seen. No gallstones, gallbladder wall thickening, or biliary dilatation. Pancreas: Unremarkable. No pancreatic ductal dilatation or surrounding inflammatory changes. Spleen: Normal in size without focal abnormality. Adrenals/Urinary Tract: Adrenal glands are unremarkable. Kidneys are normal, without  renal calculi, focal lesion, or hydronephrosis. Bladder is unremarkable. Stomach/Bowel: The appendix is fluid-filled and dilated measuring 1 cm. Appendicoliths are present. There is mild surrounding inflammatory stranding. There is no bowel obstruction, pneumatosis or free air. No focal abscess. Stomach within normal limits. Vascular/Lymphatic: Aorta and IVC are normal in size. 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 No enlarged lymph nodes are identified. Reproductive: There is a 2.6 cm hypodense area in the right ovary with layering hyperdense fluid level. Left ovary and uterus are within normal limits. Other: There is trace free fluid in the pelvis. No focal abdominal wall hernia. Musculoskeletal: No acute or significant osseous findings. IMPRESSION: 1. Acute appendicitis. No evidence for perforation or abscess. 2. 2.6 cm hypodense area in the right ovary with layering hyperdense fluid level. This may represent  a hemorrhagic cyst. This can be further evaluated with pelvic ultrasound. 3. Trace free fluid in the pelvis. 4. Stable 2 mm left lower lobe pulmonary nodule. No follow-up needed if patient is low-risk.This recommendation follows the consensus statement: Guidelines for Management of Incidental Pulmonary Nodules Detected on CT Images: From the Fleischner Society 2017; Radiology 2017; 284:228-243. Electronically Signed   By: Greig Pique M.D.   On: 02/24/2024 17:18     Medications Ordered in the ED  cefTRIAXone  (ROCEPHIN ) 2 g in sodium chloride  0.9 % 100 mL IVPB (0 g Intravenous Stopped 02/24/24 1832)    And  metroNIDAZOLE  (FLAGYL ) IVPB 500 mg (500 mg Intravenous New Bag/Given 02/24/24 1829)  morphine  (PF) 4 MG/ML injection 4 mg (4 mg Intravenous Given 02/24/24 1521)  ondansetron  (ZOFRAN ) injection 4 mg (4 mg Intravenous Given 02/24/24 1521)  sodium chloride  0.9 % bolus 1,000 mL (0 mLs Intravenous Stopped 02/24/24 1630)  potassium chloride  (KLOR-CON ) packet 40 mEq (40 mEq Oral Given 02/24/24 1609)  fentaNYL   (SUBLIMAZE ) injection 50 mcg (50 mcg Intravenous Given 02/24/24 1617)  alum & mag hydroxide-simeth (MAALOX/MYLANTA) 200-200-20 MG/5ML suspension 30 mL (30 mLs Oral Given 02/24/24 1637)    And  lidocaine  (XYLOCAINE ) 2 % viscous mouth solution 15 mL (15 mLs Oral Given 02/24/24 1637)  sodium chloride  0.9 % bolus 1,000 mL (0 mLs Intravenous Stopped 02/24/24 1818)  iohexol  (OMNIPAQUE ) 300 MG/ML solution 100 mL (100 mLs Intravenous Contrast Given 02/24/24 1703)  ondansetron  (ZOFRAN ) injection 4 mg (4 mg Intravenous Given 02/24/24 1822)  fentaNYL  (SUBLIMAZE ) injection 50 mcg (50 mcg Intravenous Given 02/24/24 1822)    Clinical Course as of 02/24/24 1851  Mon Feb 24, 2024  1611 Potassium(!): 3.1 Provided p.o. supplementation [LB]  1611 CO2(!): 13 Likely secondary to hyperventilation.  Will recheck this along with anion gap following IVF [LB]    Clinical Course User Index [LB] Minnie Tinnie BRAVO, PA                                 Medical Decision Making Amount and/or Complexity of Data Reviewed Labs: ordered. Decision-making details documented in ED Course. Radiology: ordered.  Risk OTC drugs. Prescription drug management. Decision regarding hospitalization.   Patient presents to the ED for concern of abd pain, nausea, paresthesia, this involves an extensive number of treatment options, and is a complaint that carries with it a high risk of complications and morbidity.  The differential diagnosis includes gastroenteritis, GERD, ACS, obstruction, perforation, appendicitis, CVA/TIA.  Nonexhaustive list   Co morbidities that complicate the patient evaluation  None   Additional history obtained:  Additional history obtained from Nursing   External records from outside source obtained and reviewed including triage note, ED note from 12/15/2023, imaging from ED visit   Lab Tests:  I Ordered, and personally interpreted labs.  The pertinent results include:   CBG 145 WBC 15.7   Imaging  Studies ordered:  I ordered imaging studies including CT abdomen pelvis I independently visualized and interpreted imaging which showed   Acute appendicitis. No evidence for perforation or abscess. 2.6 cm hypodense area in the right ovary with layering hyperdense fluid level. This may represent a hemorrhagic cyst. This can be further evaluated with pelvic ultrasound. Trace free fluid in the pelvis. Stable 2 mm left lower lobe pulmonary nodule. No follow-up needed if patient is low-risk. I agree with the radiologist interpretation   Cardiac Monitoring:  The patient was maintained  on a cardiac monitor.  I personally viewed and interpreted the cardiac monitored which showed an underlying rhythm of: NSR at 99 bpm with no ST nor T wave abnormalities   Medicines ordered and prescription drug management:  I ordered medication including morphine , Zofran , NS for pain, nausea, mild tachycardia, hydration Reevaluation of the patient after these medicines showed that the patient improved I have reviewed the patients home medicines and have made adjustments as needed    Consultations Obtained:  I requested consultation with general surgery Dr. Stevie,  and discussed lab and imaging findings as well as pertinent plan - they recommend:  NPO Admit to [facility]   Problem List / ED Course:  Abd pain Acutely ill-appearing with clamminess and pallor. Provided morphine , Zofran  for pain and nausea We will also do cardiac workup as pain is mostly localized in upper abdomen to rule out cardiac etiology EKG at NSR at 99 bpm with no ST nor T wave abnormalities.  Troponin WNL Does have a white count of 15.7.  Seems to be tender in RUQ, LUQ, epigastric, and periumbilical regions.  No obvious point tenderness in RLQ also endorses some pain with deep palpation in this area. With leukocytosis, tachycardia, will obtain CT imaging CT notable for acute uncomplicated appendicitis Provided rocephin  and  flagyl  Second CMP 2 and half hours later and following two IVF shows significantly improved anion gap from 22-15.  Potassium improved from 3.1-3.3 following supplementation.  CO2 improved from 13-16 UA pending Last PO was at 1200 today. Will continue to remain NPO Will admit to [facility] ED for likely appendectomy tonight. Dr. Simon accepting physician. He was notified of transfer and accepted patient via secure chat  Hypokalemia Potassium 3.1.  No flattened T waves. Improved to 3.3 following p.o. supplementation  Numbness Resolved at arrival to ED room Neuro intact with no motor nor sensory deficits on exam. No other neuro deficits reported. Low suspicion for CVA/TIA In conjunction with hypocarbia, likely 2/2 hyperventilation. She reports that she was breathing deep and fast to avoid vomiting as she ws nauseous Hypocarbia improving on repeat CMP Moving all ext WNL. All ext well perfused with palpable pulses   Reevaluation:  After the interventions noted above, I reevaluated the patient and found that they have :stayed the same    Dispostion:  After consideration of the diagnostic results and the patients response to treatment, I feel that the patent would benefit from admission to [facility] for suspected appendectomy, general surgery f/u.   Discussed ED workup, dispo with pt who expressed understanding and agrees with plan  Final diagnoses:  Acute appendicitis, unspecified acute appendicitis type    ED Discharge Orders     None        Minnie Tinnie BRAVO, PA 02/24/24 1854    Ruthe Cornet, DO 02/24/24 1859    Minnie Tinnie BRAVO, PA 02/24/24 1954    Ruthe Cornet, DO 02/24/24 1959

## 2024-02-24 NOTE — Anesthesia Preprocedure Evaluation (Addendum)
 Anesthesia Evaluation  Patient identified by MRN, date of birth, ID band Patient awake    Reviewed: Allergy & Precautions, NPO status , Patient's Chart, lab work & pertinent test results  Airway Mallampati: II  TM Distance: >3 FB Neck ROM: Full    Dental no notable dental hx.    Pulmonary neg pulmonary ROS   Pulmonary exam normal        Cardiovascular negative cardio ROS Normal cardiovascular exam     Neuro/Psych  PSYCHIATRIC DISORDERS Anxiety Depression    negative neurological ROS     GI/Hepatic Neg liver ROS,,,  Endo/Other  Patient on GLP-1 Agonist  Renal/GU negative Renal ROS     Musculoskeletal negative musculoskeletal ROS (+)    Abdominal   Peds  Hematology negative hematology ROS (+)   Anesthesia Other Findings appendicitis  Reproductive/Obstetrics Hcg negative                              Anesthesia Physical Anesthesia Plan  ASA: 2 and emergent  Anesthesia Plan: General   Post-op Pain Management:    Induction: Intravenous and Rapid sequence  PONV Risk Score and Plan: 4 or greater and Ondansetron , Dexamethasone , Midazolam  and Treatment may vary due to age or medical condition  Airway Management Planned: Oral ETT  Additional Equipment:   Intra-op Plan:   Post-operative Plan: Extubation in OR  Informed Consent: I have reviewed the patients History and Physical, chart, labs and discussed the procedure including the risks, benefits and alternatives for the proposed anesthesia with the patient or authorized representative who has indicated his/her understanding and acceptance.     Dental advisory given  Plan Discussed with: CRNA  Anesthesia Plan Comments:         Anesthesia Quick Evaluation

## 2024-02-24 NOTE — Anesthesia Procedure Notes (Signed)
 Procedure Name: Intubation Date/Time: 02/24/2024 9:00 PM  Performed by: Keil Pickering C., CRNAPre-anesthesia Checklist: Patient identified, Emergency Drugs available, Suction available, Patient being monitored and Timeout performed Patient Re-evaluated:Patient Re-evaluated prior to induction Oxygen Delivery Method: Circle system utilized Preoxygenation: Pre-oxygenation with 100% oxygen Induction Type: IV induction, Rapid sequence and Cricoid Pressure applied Laryngoscope Size: Mac and 3 Grade View: Grade I Tube type: Oral Tube size: 7.0 mm Number of attempts: 1 Airway Equipment and Method: Stylet Placement Confirmation: ETT inserted through vocal cords under direct vision, positive ETCO2 and breath sounds checked- equal and bilateral Secured at: 22 cm Tube secured with: Tape Dental Injury: Teeth and Oropharynx as per pre-operative assessment

## 2024-02-24 NOTE — Transfer of Care (Signed)
 Immediate Anesthesia Transfer of Care Note  Patient: Ruth Mccarthy  Procedure(s) Performed: APPENDECTOMY, LAPAROSCOPIC  Patient Location: PACU  Anesthesia Type:General  Level of Consciousness: awake, alert , and oriented  Airway & Oxygen Therapy: Patient Spontanous Breathing and Patient connected to nasal cannula oxygen  Post-op Assessment: Report given to RN and Post -op Vital signs reviewed and stable  Post vital signs: Reviewed and stable  Last Vitals:  Vitals Value Taken Time  BP 104/72 02/24/24 22:05  Temp 36.4 C 02/24/24 22:05  Pulse 103 02/24/24 22:07  Resp 10 02/24/24 22:07  SpO2 96 % 02/24/24 22:07  Vitals shown include unfiled device data.  Last Pain:  Vitals:   02/24/24 2012  TempSrc:   PainSc: 8          Complications: No notable events documented.

## 2024-02-24 NOTE — Op Note (Signed)
 Preoperative diagnosis: acute appendicitis with peritonitis  Postoperative diagnosis: Same   Procedure: laparoscopic appendectomy  Surgeon: Herlene Bureau, M.D.  Asst: none  Anesthesia: Gen.   Indications for procedure: Ruth Mccarthy is a 38 y.o. female with symptoms of vague pain , nausea, and vomiting consistent with acute appendicitis. Confirmed by CT.  Description of procedure: The patient was brought into the operative suite, placed supine. Anesthesia was administered with endotracheal tube. The patient's left arm was tucked. All pressure points were offloaded by foam padding. The patient was prepped and draped in the usual sterile fashion.  A transverse incision was made to the left of the umbilicus and a 5mm trocar was us . Pneumoperitoneum was applied with high flow low pressure.  2 5mm trocars were placed, one in the suprapubic space, one in the LLQ, the periumbilical incision was then up-sized and a 12mm trocar placed in that space. A transversus abdominal block was placed on the left and right sides. Next, the patient was placed in trendelenberg, rotated to the left. The omentum was retracted cephalad. The cecum and appendix were identified. The appendix was inflamed and dilated without signs of perforation. The base of the appendix was dissected and a window through the mesoappendix was created with blunt dissection. Large Hem-o-lock clips were used to doubly ligate the base of the appendix and mesoappendix. The appendix was cut free with scissors.  The appendix was placed in a specimen bag. The pelvis and RLQ were irrigated. The right ovary was seen and contained a cyst. The appendix was removed via the 12 mm trocar. 0 vicryl was used to close the fascial defect. Pneumoperitoneum was removed, all trocars were removed. All incisions were closed with 4-0 monocryl subcuticular stitch. The patient woke from anesthesia and was brought to PACU in stable condition.  Findings: acute  appendicitis  Specimen: appendix  Blood loss: 20 ml  Local anesthesia: 18 ml Marcaine   Complications: none   Herlene Bureau, M.D. General, Bariatric, & Minimally Invasive Surgery [facility]

## 2024-02-24 NOTE — ED Triage Notes (Signed)
 Pt BIB with c/o upper abd pain with nausea, cramps and numbness to bilateral hands and feet, clammy x 2 hours

## 2024-02-24 NOTE — H&P (Signed)
 Reason for Consult:abdominal pain Referring Provider: Lauren Baron  Ruth Mccarthy is an 38 y.o. female.  HPI: 38 yo female with 2 days of abdominal pain. It started intermittent but today worsened and she left work. Pain is worse in the lower abdomen. It is worse with movement. She has had nausea and vomiting. She had a normal bowel movement yesterday.  Past Medical History:  Diagnosis Date   Broken finger    No pertinent past medical history    Sciatica, left side 03/14/2021   Spontaneous vaginal delivery 12/23/2018   SVD (spontaneous vaginal delivery) 01/08/2014    Past Surgical History:  Procedure Laterality Date   EYE SURGERY     lasix   NO PAST SURGERIES     WISDOM TOOTH EXTRACTION      Family History  Problem Relation Age of Onset   Alcohol abuse Mother    Cancer Mother        cervix   Hypertension Mother    Alcohol abuse Maternal Uncle    Hyperlipidemia Maternal Grandmother    Vision loss Maternal Grandmother    Colon polyps Maternal Grandmother    Diabetes Maternal Grandfather    Hypertension Maternal Grandfather    Stroke Maternal Grandfather    Anesthesia problems Neg Hx     Social History:  reports that she has never smoked. She has never used smokeless tobacco. She reports that she does not drink alcohol and does not use drugs.  Allergies:  Allergies  Allergen Reactions   Bee Venom     Local swelling   Kiwi Extract Itching   Shrimp [Shellfish Allergy] Diarrhea and Nausea And Vomiting    Medications: I have reviewed the patient's current medications.  Results for orders placed or performed during the hospital encounter of 02/24/24 (from the past 48 hours)  Urinalysis, Routine w reflex microscopic -Urine, Clean Catch     Status: Abnormal   Collection Time: 02/24/24  2:53 PM  Result Value Ref Range   Color, Urine YELLOW YELLOW   APPearance CLEAR CLEAR   Specific Gravity, Urine 1.015 1.005 - 1.030   pH 6.0 5.0 - 8.0   Glucose, UA NEGATIVE  NEGATIVE mg/dL   Hgb urine dipstick NEGATIVE NEGATIVE   Bilirubin Urine NEGATIVE NEGATIVE   Ketones, ur >=80 (A) NEGATIVE mg/dL   Protein, ur NEGATIVE NEGATIVE mg/dL   Nitrite NEGATIVE NEGATIVE   Leukocytes,Ua NEGATIVE NEGATIVE    Comment: Microscopic not done on urines with negative protein, blood, leukocytes, nitrite, or glucose < 500 mg/dL. Performed at [facility], [address] [place], [place] [address]   Lipase, blood     Status: None   Collection Time: 02/24/24  2:54 PM  Result Value Ref Range   Lipase 28 11 - 51 U/L    Comment: Performed at [facility], [address] [place], [place] [address]  Comprehensive metabolic panel     Status: Abnormal   Collection Time: 02/24/24  2:54 PM  Result Value Ref Range   Sodium 138 135 - 145 mmol/L    Comment: Electrolytes repeated to confirm.   Potassium 3.1 (L) 3.5 - 5.1 mmol/L   Chloride 103 98 - 111 mmol/L   CO2 13 (L) 22 - 32 mmol/L   Glucose, Bld 132 (H) 70 - 99 mg/dL    Comment: Glucose reference range applies only to samples taken after fasting for at least 8 hours.   BUN 9 6 - 20 mg/dL  Creatinine, Ser 0.71 0.44 - 1.00 mg/dL   Calcium 89.8 8.9 - 89.6 mg/dL   Total Protein 7.6 6.5 - 8.1 g/dL   Albumin 4.8 3.5 - 5.0 g/dL   AST 15 15 - 41 U/L   ALT <5 0 - 44 U/L   Alkaline Phosphatase 60 38 - 126 U/L   Total Bilirubin 0.4 0.0 - 1.2 mg/dL   GFR, Estimated >39 >39 mL/min    Comment: (NOTE) Calculated using the CKD-EPI Creatinine Equation (2021)    Anion gap 22 (H) 5 - 15    Comment: Performed at [facility], [address] [place], [place] [address]  CBC     Status: Abnormal   Collection Time: 02/24/24  2:54 PM  Result Value Ref Range   WBC 15.7 (H) 4.0 - 10.5 K/uL   RBC 4.66 3.87 - 5.11 MIL/uL   Hemoglobin 13.9 12.0 - 15.0 g/dL   HCT 59.4 63.9 - 53.9 %   MCV 86.9 80.0 - 100.0 fL   MCH 29.8 26.0 - 34.0 pg   MCHC 34.3 30.0 - 36.0 g/dL   RDW 87.4 88.4 - 84.4 %   Platelets 397  150 - 400 K/uL   nRBC 0.0 0.0 - 0.2 %    Comment: Performed at [facility], [address] [place], [place] [address]  hCG, serum, qualitative     Status: None   Collection Time: 02/24/24  3:07 PM  Result Value Ref Range   Preg, Serum NEGATIVE NEGATIVE    Comment:        THE SENSITIVITY OF THIS METHODOLOGY IS >10 mIU/mL. Performed at [facility], [address] [place], [place] [address]   Troponin T, High Sensitivity     Status: None   Collection Time: 02/24/24  3:07 PM  Result Value Ref Range   Troponin T High Sensitivity <15 0 - 19 ng/L    Comment: (NOTE) Biotin concentrations > 1000 ng/mL falsely decrease TnT results.  Serial cardiac troponin measurements are suggested.  Refer to the Links section for chest pain algorithms and additional  guidance. Performed at [facility], [address] [place], [place] [address]   POC CBG, ED     Status: Abnormal   Collection Time: 02/24/24  3:17 PM  Result Value Ref Range   Glucose-Capillary 145 (H) 70 - 99 mg/dL    Comment: Glucose reference range applies only to samples taken after fasting for at least 8 hours.  Magnesium     Status: None   Collection Time: 02/24/24  3:23 PM  Result Value Ref Range   Magnesium 1.9 1.7 - 2.4 mg/dL    Comment: Performed at [facility], [address] [place], [place] [address]  Comprehensive metabolic panel     Status: Abnormal   Collection Time: 02/24/24  5:46 PM  Result Value Ref Range   Sodium 138 135 - 145 mmol/L   Potassium 3.3 (L) 3.5 - 5.1 mmol/L   Chloride 107 98 - 111 mmol/L   CO2 16 (L) 22 - 32 mmol/L   Glucose, Bld 85 70 - 99 mg/dL    Comment: Glucose reference range applies only to samples taken after fasting for at least 8 hours.   BUN 8 6 - 20 mg/dL   Creatinine, Ser 9.31 0.44 - 1.00 mg/dL   Calcium 8.5 (L) 8.9 - 10.3 mg/dL   Total Protein 6.3 (L) 6.5 - 8.1  g/dL   Albumin 4.1 3.5 - 5.0 g/dL   AST 13 (L) 15 - 41 U/L   ALT 7 0 -  44 U/L   Alkaline Phosphatase 54 38 - 126 U/L   Total Bilirubin 0.3 0.0 - 1.2 mg/dL   GFR, Estimated >39 >39 mL/min    Comment: (NOTE) Calculated using the CKD-EPI Creatinine Equation (2021)    Anion gap 15 5 - 15    Comment: Performed at [facility], [address] [place], [place] [address]    PE Blood pressure 123/84, pulse 99, temperature 98.1 F (36.7 C), temperature source Oral, resp. rate 18, height 5' 7 (1.702 m), weight 68.5 kg, last menstrual period 02/10/2024, SpO2 100%, unknown if currently breastfeeding. Constitutional: NAD; conversant; no deformities Eyes: Moist conjunctiva; no lid lag; anicteric; PERRL Neck: Trachea midline; no thyromegaly Lungs: Normal respiratory effort; no tactile fremitus CV: RRR; no palpable thrills; no pitting edema GI: Abd soft, TTP right lower quadrant and left lower quadrant; no palpable hepatosplenomegaly MSK: Normal gait; no clubbing/cyanosis Psychiatric: Appropriate affect; alert and oriented x3 Lymphatic: No palpable cervical or axillary lymphadenopathy Skin: No major subcutaneous nodules. Warm and dry   Assessment/Plan: 38 yo female with acute appendicitis -IV abx -NPO -OR for lap appendectomy -we discussed the details of the procedure; that it would be done under general anesthesia, that we would attempt to do the procedure laparoscopically. That the appendix would be isolated from the large and small intestine and then ligated and removed. We discussed the reason for this is to avoid rupture and infection and resolve the pains. We discussed risks of infection, abscess, injury to intestines or urinary structures, and need for open incision. She showed good understanding and wanted to proceed. -observe overnight   I reviewed last 24 h vitals and pain scores, last 48 h intake and output, last 24 h labs and trends, and last 24 h imaging results.  This care required high  level of medical decision making.   Ruth Mccarthy  Ruth Mccarthy 02/24/2024, 8:28 PM

## 2024-02-24 NOTE — ED Provider Notes (Signed)
 Patient transferred from [facility] for acute appendicitis.  Dr. Stevie from general surgery made aware.  On my evaluation of the patient, she is having some discomfort, some nausea.  Pain medications and antiemetics ordered.  Dr. Stevie came down to evaluate the patient, will take her to the OR this evening.   Mannie Pac T, DO 02/24/24 2018

## 2024-02-25 ENCOUNTER — Other Ambulatory Visit: Payer: Self-pay

## 2024-02-25 ENCOUNTER — Encounter (HOSPITAL_COMMUNITY): Payer: Self-pay | Admitting: General Surgery

## 2024-02-25 LAB — CBC
HCT: 38 % (ref 36.0–46.0)
Hemoglobin: 12.5 g/dL (ref 12.0–15.0)
MCH: 30.6 pg (ref 26.0–34.0)
MCHC: 32.9 g/dL (ref 30.0–36.0)
MCV: 92.9 fL (ref 80.0–100.0)
Platelets: 291 K/uL (ref 150–400)
RBC: 4.09 MIL/uL (ref 3.87–5.11)
RDW: 12.9 % (ref 11.5–15.5)
WBC: 15.4 K/uL — ABNORMAL HIGH (ref 4.0–10.5)
nRBC: 0 % (ref 0.0–0.2)

## 2024-02-25 MED ORDER — ACETAMINOPHEN 500 MG PO TABS: 1000.0000 mg | ORAL_TABLET | Freq: Four times a day (QID) | ORAL | Status: AC | PRN

## 2024-02-25 MED ORDER — ACETAMINOPHEN 500 MG PO TABS: 1000.0000 mg | ORAL_TABLET | Freq: Four times a day (QID) | ORAL | Status: DC | PRN

## 2024-02-25 MED ORDER — TRAMADOL HCL 50 MG PO TABS: 50.0000 mg | ORAL_TABLET | Freq: Four times a day (QID) | ORAL | 0 refills | Status: AC | PRN

## 2024-02-25 MED ORDER — IBUPROFEN 600 MG PO TABS: 600.0000 mg | ORAL_TABLET | Freq: Four times a day (QID) | ORAL | Status: AC | PRN

## 2024-02-25 MED ORDER — WEGOVY 2.4 MG/0.75ML ~~LOC~~ SOAJ: 2.4000 mg | SUBCUTANEOUS | Status: AC

## 2024-02-25 NOTE — Discharge Instructions (Signed)

## 2024-02-25 NOTE — Plan of Care (Signed)
  Problem: Clinical Measurements: Goal: Ability to maintain clinical measurements within normal limits will improve Outcome: Progressing Goal: Diagnostic test results will improve Outcome: Progressing   Problem: Pain Managment: Goal: General experience of comfort will improve and/or be controlled Outcome: Progressing

## 2024-02-25 NOTE — Discharge Summary (Signed)
    Patient ID: Ruth Mccarthy [id number] 08-Jun-1986 38 y.o.  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 It started intermittent but today worsened and she left work. Pain is worse in the lower abdomen. It is worse with movement. She has had nausea and vomiting. She had a normal bowel movement yesterday.   Procedures Lap appy, Dr. Stevie 02/24/2024  Hospital Course:  The patient was admitted and underwent a laparoscopic appendectomy.  The patient tolerated the procedure well.  On POD 1, the patient was tolerating a regular diet, voiding well, mobilizing, and pain was controlled with oral pain medications.  The patient was stable for DC home at this time with appropriate follow up made.   Physical Exam: Abd: soft, appropriately tender, +BS, ND, incisions c/d/i  Allergies as of 02/25/2024       Reactions   Bee Venom    Local swelling   Kiwi Extract Itching   Shrimp [shellfish Allergy] Diarrhea, Nausea And Vomiting   Wasp Venom Protein Other (See Comments)   Silicone Itching, Rash        Medication List     TAKE these medications    acetaminophen  500 MG tablet Commonly known as: TYLENOL  Take 2 tablets (1,000 mg total) by mouth every 6 (six) hours as needed for moderate pain (pain score 4-6), fever or headache.   bismuth subsalicylate 262 MG/15ML suspension Commonly known as: PEPTO BISMOL Take 30 mLs by mouth every 6 (six) hours as needed for  indigestion or diarrhea or loose stools.   cetirizine 10 MG tablet Commonly known as: ZYRTEC Take 10 mg by mouth daily.   EPINEPHrine  0.3 mg/0.3 mL Soaj injection Commonly known as: EpiPen  2-Pak Inject 0.3 mg into the muscle as needed for anaphylaxis.   FLUoxetine  20 MG capsule Commonly known as: PROZAC  TAKE 1 CAPSULE(20 MG) BY MOUTH DAILY   ibuprofen  600 MG tablet Commonly known as: ADVIL  Take 1 tablet (600 mg total) by mouth every 6 (six) hours as needed (for mild pain not relieved by other medications.).   traMADol  50 MG tablet Commonly known as: ULTRAM  Take 1 tablet (50 mg total) by mouth every 6 (six) hours as needed (mild pain).   Wegovy  2.4 MG/0.75ML Soaj SQ injection Generic drug: semaglutide -weight management Inject 2.4 mg into the skin once a week. Start taking on: March 06, 2024 What changed: These instructions start on March 06, 2024. If you are unsure what to do until then, ask your doctor or other care provider.           Signed: Burnard Banter, [facility] 02/25/2024, 10:56 AM Please see Amion for pager number during day hours 7:00am-4:30pm, 7-11:30am on Weekends

## 2024-02-26 LAB — SURGICAL PATHOLOGY

## 2024-02-26 NOTE — Anesthesia Postprocedure Evaluation (Signed)
 Anesthesia Post Note  Patient: Ruth Mccarthy  Procedure(s) Performed: APPENDECTOMY, LAPAROSCOPIC     Patient location during evaluation: PACU Anesthesia Type: General Level of consciousness: awake Pain management: pain level controlled Vital Signs Assessment: post-procedure vital signs reviewed and stable Respiratory status: spontaneous breathing, nonlabored ventilation and respiratory function stable Cardiovascular status: blood pressure returned to baseline and stable Postop Assessment: no apparent nausea or vomiting Anesthetic complications: no   No notable events documented.  Last Vitals:  Vitals:   02/25/24 0558 02/25/24 0951  BP: 103/67 102/60  Pulse: 95 70  Resp: 18 18  Temp: 36.6 C 36.8 C  SpO2: 99% 100%    Last Pain:  Vitals:   02/25/24 1109  TempSrc:   PainSc: 0-No pain   Pain Goal:                   Bernardino SQUIBB Tedi Hughson

## 2024-02-27 ENCOUNTER — Ambulatory Visit: Payer: Self-pay

## 2024-02-27 ENCOUNTER — Encounter (HOSPITAL_BASED_OUTPATIENT_CLINIC_OR_DEPARTMENT_OTHER): Payer: Self-pay

## 2024-02-27 NOTE — Telephone Encounter (Signed)
 FYI Only or Action Required?: FYI only for provider.  Patient was last seen in primary care on 12/31/2023 by Daryl Setter, NP.  Called Nurse Triage reporting Post-op Problem, Abdominal Pain, dry heaving, barely eating, frequent tylenol  use, Nausea, Diarrhea, and Headache.  Symptoms began several days ago.  Interventions attempted: OTC medications: tylenol  with dosing potentially exceeding 4,000 mg/24 hr, ibuprofen  and Rest, hydration, or home remedies.  Symptoms are: rapidly worsening.  Triage Disposition: Go to ED Now (or PCP Triage)  Patient/caregiver understands and will follow disposition?: Yes      Copied from CRM [id number]. Topic: Clinical - Red Word Triage >> Feb 27, 2024 11:37 AM Dedra NOVAK wrote: Red Word that prompted transfer to Nurse Triage: Pt had appendectomy Monday night. She has been having stomach pain, diarrhea, nausea, and migraines since. Warm transfer to [facility] nurse triage. Reason for Disposition  Sounds like a serious complication to the triager  Answer Assessment - Initial Assessment Questions 1. SYMPTOM: What's the main symptom you're concerned about? (e.g., pain, fever, vomiting) Appendectomy with [facility] on Monday     They didn't tell me a lot no, just said would take a while to get appetite back, confirms did not get instructions on what to look out for and what symptoms to expect Asking for nausea med Been taking tylenol  and ibuprofen  for headaches, helps but get headaches worse when asleep Feeling worse than did when discharged, yesterday feeling worse, couldn't get out of bed, still having bouts of nausea and dry-heaving Stomach pain, diarrhea, nausea, migraines Eating barely anything Trying to drink water Used to getting hormonal migraines but  I'll take med, took 3 x 650 mg tylenol  didn't work, 2 hours later took 2 more, headache been better, this was all today, don't remember last time took tylenol  before, might have taken 3 before went to bed at  8 pm 3. SURGERY: What surgery did you have?     Appendectomy laparoscopic 4. DATE of SURGERY: When was the surgery?      8/18 5. ANESTHESIA: What type of anesthesia did you have? (e.g., general, spinal, epidural, local)     general 7. PAIN: Is there any pain? If Yes, ask: How bad is it?  (Scale 0-10; or none, mild, moderate, severe)     Feel worse than did when discharged 8. FEVER: Do you have a fever? If Yes, ask: What is your temperature, how was it measured, and when did it start?     denies  Advised pt contact surgeon  Advised ED right away, have another adult drive her, for symptoms with recent abdominal surgery and potential overdosing of tylenol , advised want to keep tylenol  to 4,000 mg /24 hr, sending message to PCP for follow up  Protocols used: Post-Op Symptoms and Questions-A-AH

## 2024-03-16 ENCOUNTER — Encounter: Admitting: Family

## 2024-03-18 ENCOUNTER — Encounter: Admitting: Family
